# Patient Record
Sex: Female | Born: 1980
Health system: Southern US, Community
[De-identification: ages and names within clinical notes are randomized; demographics above are authoritative.]

## PROBLEM LIST (undated history)

## (undated) DIAGNOSIS — F329 Major depressive disorder, single episode, unspecified: Secondary | ICD-10-CM

## (undated) DIAGNOSIS — J342 Deviated nasal septum: Secondary | ICD-10-CM

## (undated) DIAGNOSIS — F419 Anxiety disorder, unspecified: Secondary | ICD-10-CM

## (undated) DIAGNOSIS — K219 Gastro-esophageal reflux disease without esophagitis: Secondary | ICD-10-CM

## (undated) DIAGNOSIS — F32A Depression, unspecified: Secondary | ICD-10-CM

## (undated) DIAGNOSIS — T7840XA Allergy, unspecified, initial encounter: Secondary | ICD-10-CM

## (undated) DIAGNOSIS — M1990 Unspecified osteoarthritis, unspecified site: Secondary | ICD-10-CM

## (undated) HISTORY — PX: FRACTURE SURGERY: SHX138

## (undated) HISTORY — DX: Anxiety disorder, unspecified: F41.9

## (undated) HISTORY — DX: Allergy, unspecified, initial encounter: T78.40XA

## (undated) HISTORY — PX: NO PAST SURGERIES: SHX2092

## (undated) HISTORY — PX: JOINT REPLACEMENT: SHX530

## (undated) HISTORY — DX: Major depressive disorder, single episode, unspecified: F32.9

## (undated) HISTORY — DX: Depression, unspecified: F32.A

---

## 2005-03-28 ENCOUNTER — Other Ambulatory Visit: Admission: RE | Admit: 2005-03-28 | Discharge: 2005-03-28 | Payer: Self-pay | Admitting: Gynecology

## 2006-05-21 ENCOUNTER — Other Ambulatory Visit: Admission: RE | Admit: 2006-05-21 | Discharge: 2006-05-21 | Payer: Self-pay | Admitting: Gynecology

## 2013-10-12 ENCOUNTER — Ambulatory Visit (INDEPENDENT_AMBULATORY_CARE_PROVIDER_SITE_OTHER): Payer: BC Managed Care – PPO | Admitting: Family Medicine

## 2013-10-12 VITALS — BP 110/80 | HR 103 | Temp 97.9°F | Resp 16 | Ht 61.5 in | Wt 207.0 lb

## 2013-10-12 DIAGNOSIS — R197 Diarrhea, unspecified: Secondary | ICD-10-CM

## 2013-10-12 DIAGNOSIS — R112 Nausea with vomiting, unspecified: Secondary | ICD-10-CM

## 2013-10-12 LAB — POCT CBC
Granulocyte percent: 91.4 %G — AB (ref 37–80)
HCT, POC: 45.7 % (ref 37.7–47.9)
Hemoglobin: 14.9 g/dL (ref 12.2–16.2)
Lymph, poc: 0.7 (ref 0.6–3.4)
MCH, POC: 30.2 pg (ref 27–31.2)
MCHC: 32.6 g/dL (ref 31.8–35.4)
MCV: 92.7 fL (ref 80–97)
MID (cbc): 0.4 (ref 0–0.9)
MPV: 8.9 fL (ref 0–99.8)
POC Granulocyte: 12 — AB (ref 2–6.9)
POC LYMPH PERCENT: 5.6 %L — AB (ref 10–50)
POC MID %: 3 %M (ref 0–12)
Platelet Count, POC: 250 10*3/uL (ref 142–424)
RBC: 4.93 M/uL (ref 4.04–5.48)
RDW, POC: 12.8 %
WBC: 13.1 10*3/uL — AB (ref 4.6–10.2)

## 2013-10-12 MED ORDER — ONDANSETRON 4 MG PO TBDP: 4.0000 mg | ORAL_TABLET | Freq: Three times a day (TID) | ORAL | Status: DC | PRN

## 2013-10-12 MED ORDER — SODIUM CHLORIDE 0.9 % IV SOLN: 4.0000 mg | Freq: Once | INTRAVENOUS | Status: DC

## 2013-10-12 MED ORDER — ONDANSETRON HCL 4 MG/2ML IJ SOLN
2.0000 mg | Freq: Once | INTRAMUSCULAR | Status: AC
  Administered 2013-10-12: 2 mg via INTRAMUSCULAR

## 2013-10-12 NOTE — Progress Notes (Signed)
Subjective:    Patient ID: Brandi Foster, female    DOB: [birth year], 33 y.o.   MRN: [id number]  HPI This chart was scribed for Elvina SidleKurt Lauenstein, MD by Andrew Auaven Small, ED Scribe. This patient was seen in room 1 and the patient's care was started at 8:56 AM.  HPI Comments: Brandi Foster is a 33 y.o. female who presents to the [facility] complaining of constant, simultaneous episodes of emesis and diarrhea onset 5 hours ago. Pt states that she woke up this morning at 4 AM and had her first episode of emesis. She states that shortly after she had diarrhea. She reports that she would take a sip of water and 30 mins later have multiple episodes of emesis. She reports that her last episode of emesis was while sitting in the waiting room and that her last episode of diarrhea was about 10 mins ago. She reports nausea. She denies abdominal pain. Pt states that she is on birth control. She states she takes zyrtec for allergies.    No past medical history on file. Allergies  Allergen Reactions   Codeine Other (See Comments)    Was told as a child she was allergic   Sulphadimidine [Sulfamethazine] Other (See Comments)    Was told as a child she was allergic to it.    Prior to Admission medications   Medication Sig Start Date End Date Taking? Authorizing Provider  cetirizine (ZYRTEC) 10 MG tablet Take 10 mg by mouth daily.   Yes Historical Provider, MD  norgestimate-ethinyl estradiol (SPRINTEC 28) 0.25-35 MG-MCG tablet Take 1 tablet by mouth daily.   Yes Historical Provider, MD  sertraline (ZOLOFT) 25 MG tablet Take 25 mg by mouth daily.   Yes Historical Provider, MD    Review of Systems  Gastrointestinal: Positive for nausea, vomiting and diarrhea. Negative for abdominal pain.       Objective:   Physical Exam  Nursing note and vitals reviewed. Constitutional: She is oriented to person, place, and time. She appears well-developed and well-nourished. No distress.  HENT:    Head: Normocephalic and atraumatic.  Eyes: EOM are normal.  Neck: Neck supple.  Cardiovascular: Normal rate, regular rhythm and normal heart sounds.  Exam reveals no gallop and no friction rub.   No murmur heard. Pulmonary/Chest: Effort normal and breath sounds normal.  Abdominal:  Absent bowel sounds   Musculoskeletal: Normal range of motion.  Neurological: She is alert and oriented to person, place, and time.  Skin: Skin is warm and dry.  Psychiatric: She has a normal mood and affect. Her behavior is normal.   Results for orders placed in visit on 10/12/13  POCT CBC      Result Value Ref Range   WBC 13.1 (*) 4.6 - 10.2 K/uL   Lymph, poc 0.7  0.6 - 3.4   POC LYMPH PERCENT 5.6 (*) 10 - 50 %L   MID (cbc) 0.4  0 - 0.9   POC MID % 3.0  0 - 12 %M   POC Granulocyte 12.0 (*) 2 - 6.9   Granulocyte percent 91.4 (*) 37 - 80 %G   RBC 4.93  4.04 - 5.48 M/uL   Hemoglobin 14.9  12.2 - 16.2 g/dL   HCT, POC 40.945.7  81.137.7 - 47.9 %   MCV 92.7  80 - 97 fL   MCH, POC 30.2  27 - 31.2 pg   MCHC 32.6  31.8 - 35.4 g/dL   RDW, POC 12.8  Platelet Count, POC 250  142 - 424 K/uL   MPV 8.9  0 - 99.8 fL       Assessment & Plan:    Meds ordered this encounter  Medications   sertraline (ZOLOFT) 25 MG tablet    Sig: Take 25 mg by mouth daily.   cetirizine (ZYRTEC) 10 MG tablet    Sig: Take 10 mg by mouth daily.   norgestimate-ethinyl estradiol (SPRINTEC 28) 0.25-35 MG-MCG tablet    Sig: Take 1 tablet by mouth daily.   ondansetron (ZOFRAN) injection 2 mg    Sig:

## 2013-10-12 NOTE — Patient Instructions (Signed)
Nausea and Vomiting °Nausea is a sick feeling that often comes before throwing up (vomiting). Vomiting is a reflex where stomach contents come out of your mouth. Vomiting can cause severe loss of body fluids (dehydration). Children and elderly adults can become dehydrated quickly, especially if they also have diarrhea. Nausea and vomiting are symptoms of a condition or disease. It is important to find the cause of your symptoms. °CAUSES  °· Direct irritation of the stomach lining. This irritation can result from increased acid production (gastroesophageal reflux disease), infection, food poisoning, taking certain medicines (such as nonsteroidal anti-inflammatory drugs), alcohol use, or tobacco use. °· Signals from the brain. These signals could be caused by a headache, heat exposure, an inner ear disturbance, increased pressure in the brain from injury, infection, a tumor, or a concussion, pain, emotional stimulus, or metabolic problems. °· An obstruction in the gastrointestinal tract (bowel obstruction). °· Illnesses such as diabetes, hepatitis, gallbladder problems, appendicitis, kidney problems, cancer, sepsis, atypical symptoms of a heart attack, or eating disorders. °· Medical treatments such as chemotherapy and radiation. °· Receiving medicine that makes you sleep (general anesthetic) during surgery. °DIAGNOSIS °Your caregiver may ask for tests to be done if the problems do not improve after a few days. Tests may also be done if symptoms are severe or if the reason for the nausea and vomiting is not clear. Tests may include: °· Urine tests. °· Blood tests. °· Stool tests. °· Cultures (to look for evidence of infection). °· X-rays or other imaging studies. °Test results can help your caregiver make decisions about treatment or the need for additional tests. °TREATMENT °You need to stay well hydrated. Drink frequently but in small amounts. You may wish to drink water, sports drinks, clear broth, or eat frozen  ice pops or gelatin dessert to help stay hydrated. When you eat, eating slowly may help prevent nausea. There are also some antinausea medicines that may help prevent nausea. °HOME CARE INSTRUCTIONS  °· Take all medicine as directed by your caregiver. °· If you do not have an appetite, do not force yourself to eat. However, you must continue to drink fluids. °· If you have an appetite, eat a normal diet unless your caregiver tells you differently. °· Eat a variety of complex carbohydrates (rice, wheat, potatoes, bread), lean meats, yogurt, fruits, and vegetables. °· Avoid high-fat foods because they are more difficult to digest. °· Drink enough water and fluids to keep your urine clear or pale yellow. °· If you are dehydrated, ask your caregiver for specific rehydration instructions. Signs of dehydration may include: °· Severe thirst. °· Dry lips and mouth. °· Dizziness. °· Dark urine. °· Decreasing urine frequency and amount. °· Confusion. °· Rapid breathing or pulse. °SEEK IMMEDIATE MEDICAL CARE IF:  °· You have blood or brown flecks (like coffee grounds) in your vomit. °· You have black or bloody stools. °· You have a severe headache or stiff neck. °· You are confused. °· You have severe abdominal pain. °· You have chest pain or trouble breathing. °· You do not urinate at least once every 8 hours. °· You develop cold or clammy skin. °· You continue to vomit for longer than 24 to 48 hours. °· You have a fever. °MAKE SURE YOU:  °· Understand these instructions. °· Will watch your condition. °· Will get help right away if you are not doing well or get worse. °Document Released: 07/24/2005 Document Revised: 10/16/2011 Document Reviewed: 12/21/2010 °Exit[employer]® Patient Information ©2014 [employer]. ° °Diarrhea °Diarrhea is frequent   loose and watery bowel movements. It can cause you to feel weak and dehydrated. Dehydration can cause you to become tired and thirsty, have a dry mouth, and have decreased urination that  often is dark yellow. Diarrhea is a sign of another problem, most often an infection that will not last long. In most cases, diarrhea typically lasts 2 3 days. However, it can last longer if it is a sign of something more serious. It is important to treat your diarrhea as directed by your caregive to lessen or prevent future episodes of diarrhea. °CAUSES  °Some common causes include: °· Gastrointestinal infections caused by viruses, bacteria, or parasites. °· Food poisoning or food allergies. °· Certain medicines, such as antibiotics, chemotherapy, and laxatives. °· Artificial sweeteners and fructose. °· Digestive disorders. °HOME CARE INSTRUCTIONS °· Ensure adequate fluid intake (hydration): have 1 cup (8 oz) of fluid for each diarrhea episode. Avoid fluids that contain simple sugars or sports drinks, fruit juices, whole milk products, and sodas. Your urine should be clear or pale yellow if you are drinking enough fluids. Hydrate with an oral rehydration solution that you can purchase at pharmacies, retail stores, and online. You can prepare an oral rehydration solution at home by mixing the following ingredients together: °·   tsp table salt. °· ¾ tsp baking soda. °·  tsp salt substitute containing potassium chloride. °· 1  tablespoons sugar. °· 1 L (34 oz) of water. °· Certain foods and beverages may increase the speed at which food moves through the gastrointestinal (GI) tract. These foods and beverages should be avoided and include: °· Caffeinated and alcoholic beverages. °· High-fiber foods, such as raw fruits and vegetables, nuts, seeds, and whole grain breads and cereals. °· Foods and beverages sweetened with sugar alcohols, such as xylitol, sorbitol, and mannitol. °· Some foods may be well tolerated and may help thicken stool including: °· Starchy foods, such as rice, toast, pasta, low-sugar cereal, oatmeal, grits, baked potatoes, crackers, and bagels. °· Bananas. °· Applesauce. °· Add probiotic-rich foods  to help increase healthy bacteria in the GI tract, such as yogurt and fermented milk products. °· Wash your hands well after each diarrhea episode. °· Only take over-the-counter or prescription medicines as directed by your caregiver. °· Take a warm bath to relieve any burning or pain from frequent diarrhea episodes. °SEEK IMMEDIATE MEDICAL CARE IF:  °· You are unable to keep fluids down. °· You have persistent vomiting. °· You have blood in your stool, or your stools are black and tarry. °· You do not urinate in 6 8 hours, or there is only a small amount of very dark urine. °· You have abdominal pain that increases or localizes. °· You have weakness, dizziness, confusion, or lightheadedness. °· You have a severe headache. °· Your diarrhea gets worse or does not get better. °· You have a fever or persistent symptoms for more than 2 3 days. °· You have a fever and your symptoms suddenly get worse. °MAKE SURE YOU:  °· Understand these instructions. °· Will watch your condition. °· Will get help right away if you are not doing well or get worse. °Document Released: 07/14/2002 Document Revised: 07/10/2012 Document Reviewed: 03/31/2012 °ExitCare® Patient Information ©2014 [employer]. ° °

## 2014-09-04 ENCOUNTER — Ambulatory Visit (INDEPENDENT_AMBULATORY_CARE_PROVIDER_SITE_OTHER): Payer: 59

## 2014-09-04 ENCOUNTER — Ambulatory Visit (INDEPENDENT_AMBULATORY_CARE_PROVIDER_SITE_OTHER): Payer: 59 | Admitting: Emergency Medicine

## 2014-09-04 VITALS — BP 134/92 | HR 75 | Temp 98.7°F | Resp 16 | Ht 62.75 in | Wt 225.0 lb

## 2014-09-04 DIAGNOSIS — R059 Cough, unspecified: Secondary | ICD-10-CM

## 2014-09-04 DIAGNOSIS — R05 Cough: Secondary | ICD-10-CM

## 2014-09-04 IMAGING — CR DG CHEST 2V
2 series · 2 of 2 positions shown · non-contrast
Comparison: None.

CLINICAL DATA: Cough

EXAM:
CHEST  2 VIEW

[PA]
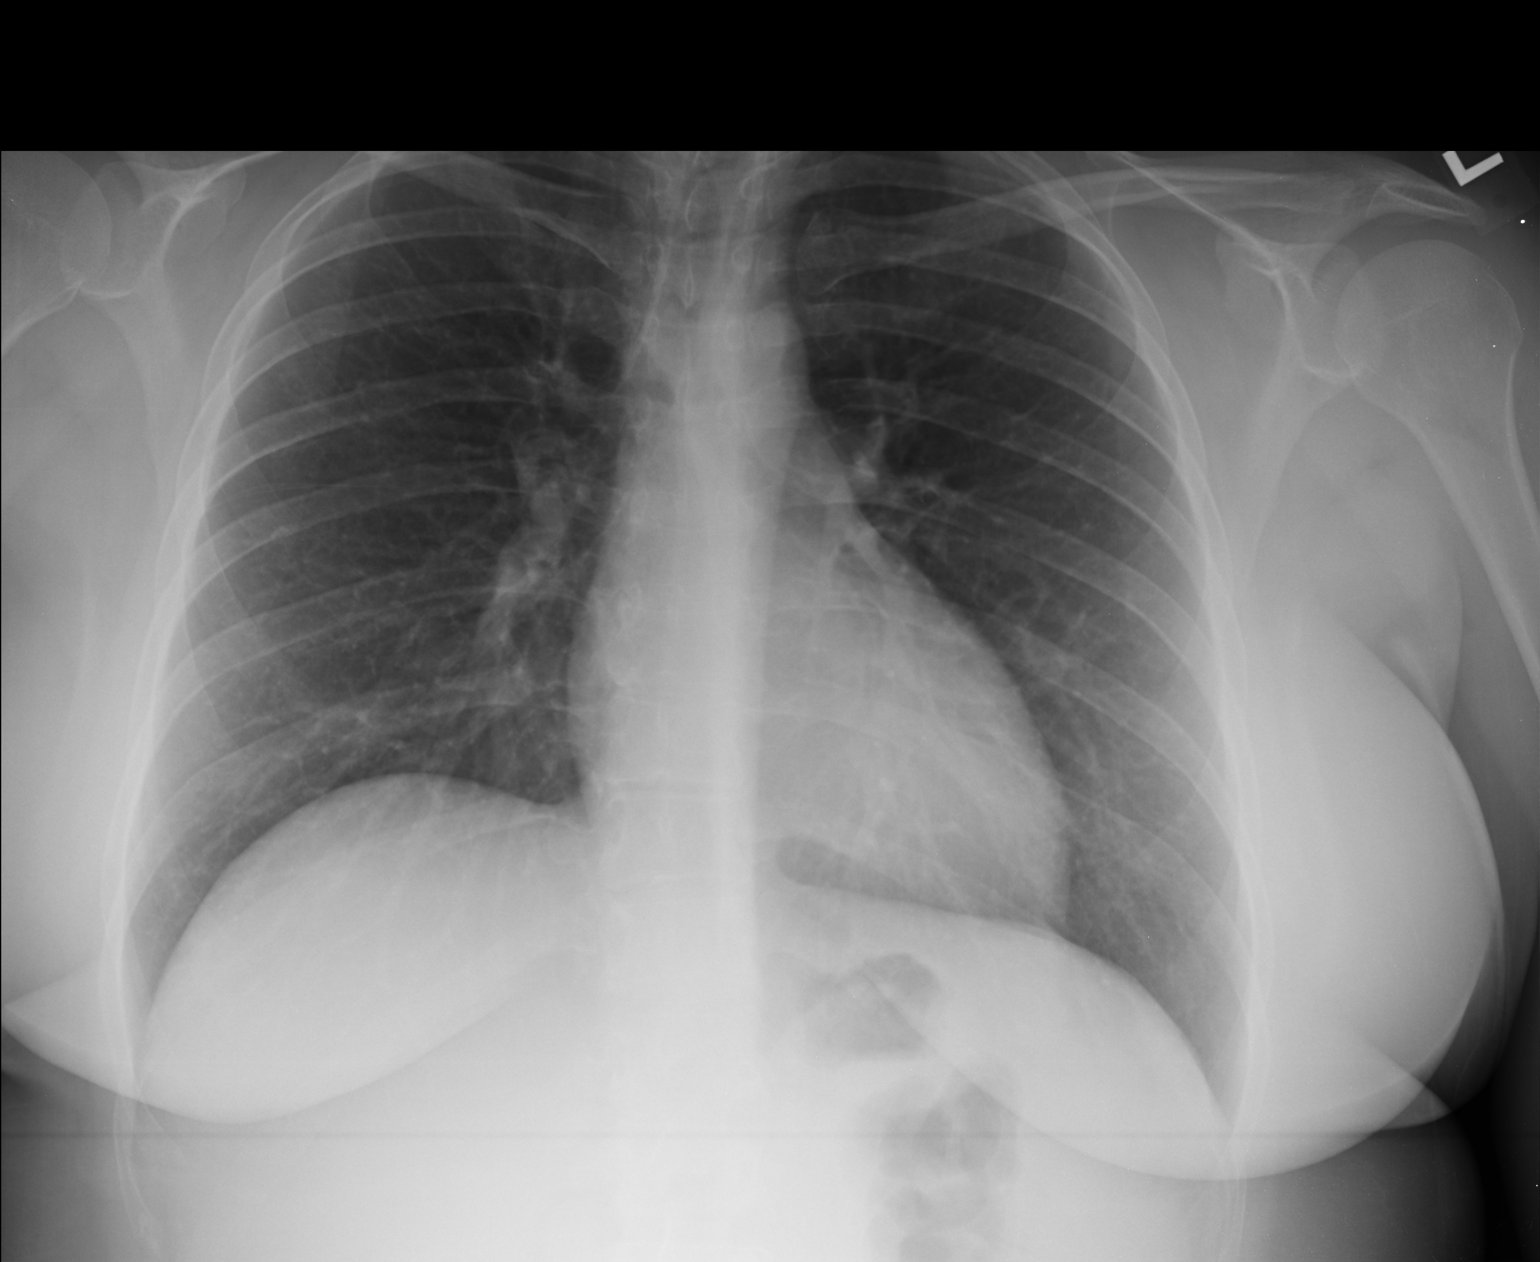

[lateral]
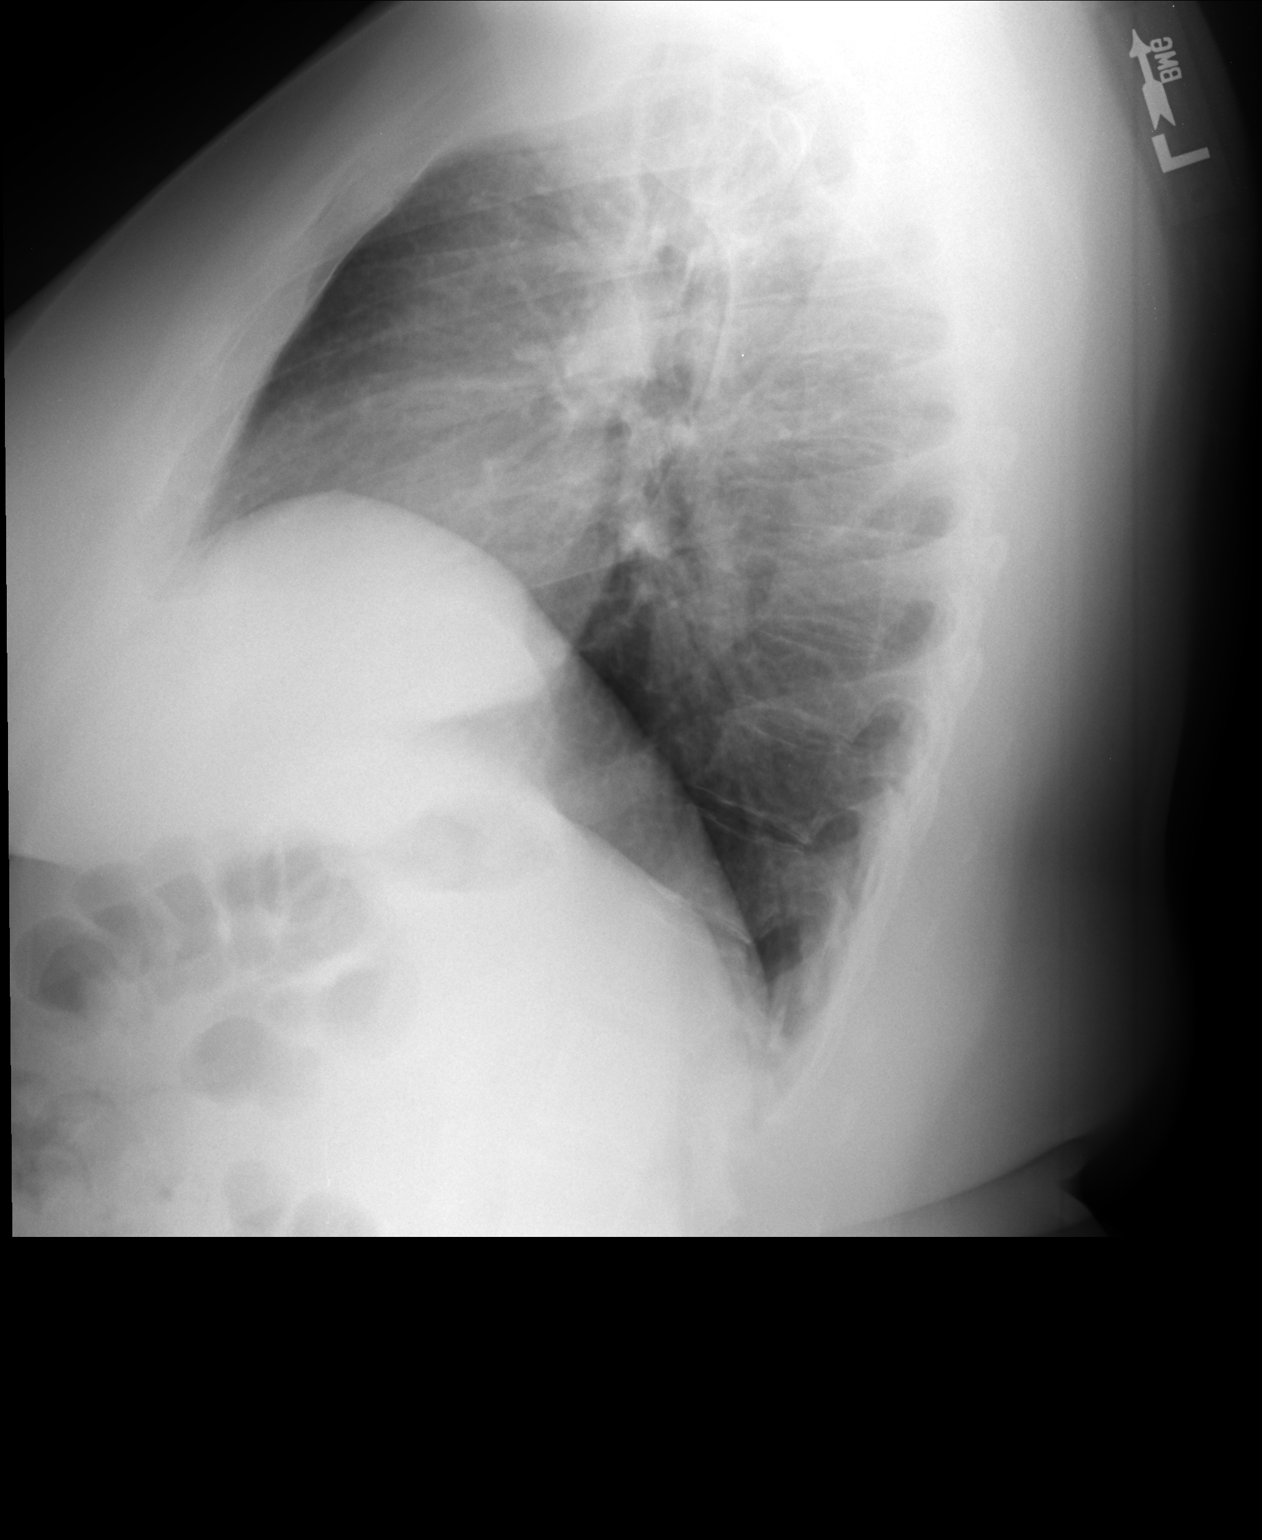

[2 of 2 positions shown; findings below may reference images not displayed]

FINDINGS: The heart size and mediastinal contours are within normal limits.
Both lungs are clear. The visualized skeletal structures are
unremarkable.
IMPRESSION: No active cardiopulmonary disease.

## 2014-09-04 MED ORDER — PREDNISONE 10 MG PO TABS: ORAL_TABLET | ORAL | Status: DC

## 2014-09-04 MED ORDER — OMEPRAZOLE 20 MG PO CPDR: 20.0000 mg | DELAYED_RELEASE_CAPSULE | Freq: Every day | ORAL | Status: DC

## 2014-09-04 NOTE — Progress Notes (Addendum)
Subjective:  This chart was scribed for Lesle ChrisSteven Maylin Freeburg MD, by Veverly FellsHatice Demirci,scribe, at [facility].  This patient was seen in room 4 and the patient's care was started at 3:25 PM.    Patient ID: Brandi MouldHeather J Crews, female    DOB: 06-Oct-1980, 34 y.o.   MRN: [id number]  HPI HPI Comments: Brandi MouldHeather J Crews is a 34 y.o. female who presents to [facility] with a cough which she has had for a month.  States she has coughing fits and is out of breath more easily than usual. She notes of postnasal drip but states that everything else is dry. She also notes of having reflux symptoms and had heart burn and threw up four nights ago.  She notes she feels fatigued and states her cough is keeping her up at night. Patient is not a smoker,is on birth control pills and not currently pregnant. Denies a fever.       There are no active problems to display for this patient.  History reviewed. No pertinent past medical history. History reviewed. No pertinent past surgical history. Allergies  Allergen Reactions  . Codeine Other (See Comments)    Was told as a child she was allergic  . Sulphadimidine [Sulfamethazine] Other (See Comments)    Was told as a child she was allergic to it.    Prior to Admission medications   Medication Sig Start Date End Date Taking? Authorizing Provider  cetirizine (ZYRTEC) 10 MG tablet Take 10 mg by mouth daily.   Yes Historical Provider, MD  norgestimate-ethinyl estradiol (SPRINTEC 28) 0.25-35 MG-MCG tablet Take 1 tablet by mouth daily.   Yes Historical Provider, MD  ondansetron (ZOFRAN ODT) 4 MG disintegrating tablet Take 1 tablet (4 mg total) by mouth every 8 (eight) hours as needed for nausea or vomiting. 10/12/13  Yes Elvina SidleKurt Lauenstein, MD  sertraline (ZOLOFT) 25 MG tablet Take 25 mg by mouth daily.   Yes Historical Provider, MD   History   Social History  . Marital Status: Single    Spouse Name: N/A    Number of Children: N/A  . Years of  Education: N/A   Occupational History  . Not on file.   Social History Main Topics  . Smoking status: Never Smoker   . Smokeless tobacco: Never Used  . Alcohol Use: Yes  . Drug Use: No  . Sexual Activity: Not on file   Other Topics Concern  . Not on file   Social History Narrative       Review of Systems  Constitutional: Negative for fever and chills.  HENT: Positive for postnasal drip.   Respiratory: Positive for cough.        Objective:   Physical Exam CONSTITUTIONAL: Well developed/well nourished HEAD: Normocephalic/atraumatic EYES: EOMI/PERRL ENMT: Mucous membranes moist NECK: supple no meningeal signs SPINE/BACK:entire spine nontender CV: S1/S2 noted, no murmurs/rubs/gallops noted LUNGS: Lungs are clear to auscultation bilaterally.  She has a high pitched croup like cough.   ABDOMEN: soft, nontender, no rebound or guarding, bowel sounds noted throughout abdomen GU:no cva tenderness NEURO: Pt is awake/alert/appropriate, moves all extremitiesx4.  No facial droop.   EXTREMITIES: pulses normal/equal, full ROM SKIN: warm, color normal PSYCH: no abnormalities of mood noted, alert and oriented to situation      Filed Vitals:   09/04/14 1519  BP: 134/92  Pulse: 75  Temp: 98.7 F (37.1 C)  TempSrc: Oral  Resp: 16  Height: 5' 2.75" (1.594 m)  Weight: 225 lb (102.059 kg)  SpO2: 99%  [facility] reading (PRIMARY) by  Dr.Kaytie Ratcliffe no acute disease      Assessment & Plan:  She will be on prednisone 20 mg a day for 3 days then 10 mg a day for 3 days. Will also have her on Prilosec 20 mg daily. I personally performed the services described in this documentation, which was scribed in my presence. The recorded information has been reviewed and is accurate.

## 2014-09-04 NOTE — Progress Notes (Deleted)
   Subjective:    Patient ID: Brandi MouldHeather J Crews, female    DOB: 11/29/1980, 34 y.o.   MRN: [id number]  HPI    Review of Systems     Objective:   Physical Exam        Assessment & Plan:

## 2015-01-11 ENCOUNTER — Emergency Department (HOSPITAL_COMMUNITY)
Admission: EM | Admit: 2015-01-11 | Discharge: 2015-01-11 | Disposition: A | Discharge status: Home / Self Care | Payer: 59 | Attending: Emergency Medicine | Admitting: Emergency Medicine

## 2015-01-11 ENCOUNTER — Encounter (HOSPITAL_COMMUNITY): Payer: Self-pay

## 2015-01-11 ENCOUNTER — Emergency Department (HOSPITAL_COMMUNITY): Payer: 59

## 2015-01-11 DIAGNOSIS — Y9389 Activity, other specified: Secondary | ICD-10-CM | POA: Diagnosis not present

## 2015-01-11 DIAGNOSIS — S52182A Other fracture of upper end of left radius, initial encounter for closed fracture: Secondary | ICD-10-CM | POA: Insufficient documentation

## 2015-01-11 DIAGNOSIS — Z79899 Other long term (current) drug therapy: Secondary | ICD-10-CM | POA: Insufficient documentation

## 2015-01-11 DIAGNOSIS — Y998 Other external cause status: Secondary | ICD-10-CM | POA: Insufficient documentation

## 2015-01-11 DIAGNOSIS — S59902A Unspecified injury of left elbow, initial encounter: Secondary | ICD-10-CM | POA: Diagnosis present

## 2015-01-11 DIAGNOSIS — S52092A Other fracture of upper end of left ulna, initial encounter for closed fracture: Secondary | ICD-10-CM | POA: Insufficient documentation

## 2015-01-11 DIAGNOSIS — W1839XA Other fall on same level, initial encounter: Secondary | ICD-10-CM | POA: Insufficient documentation

## 2015-01-11 DIAGNOSIS — S52102A Unspecified fracture of upper end of left radius, initial encounter for closed fracture: Secondary | ICD-10-CM

## 2015-01-11 DIAGNOSIS — S52202A Unspecified fracture of shaft of left ulna, initial encounter for closed fracture: Secondary | ICD-10-CM

## 2015-01-11 DIAGNOSIS — Y9289 Other specified places as the place of occurrence of the external cause: Secondary | ICD-10-CM | POA: Insufficient documentation

## 2015-01-11 IMAGING — CR DG ELBOW COMPLETE 3+V*L*
4 series · 4 of 4 positions shown · non-contrast
Comparison: None.

CLINICAL DATA: Slip and fall with elbow pain and obvious deformity,
initial encounter

EXAM:
LEFT ELBOW - COMPLETE 3+ VIEW

[x elbow ap left]
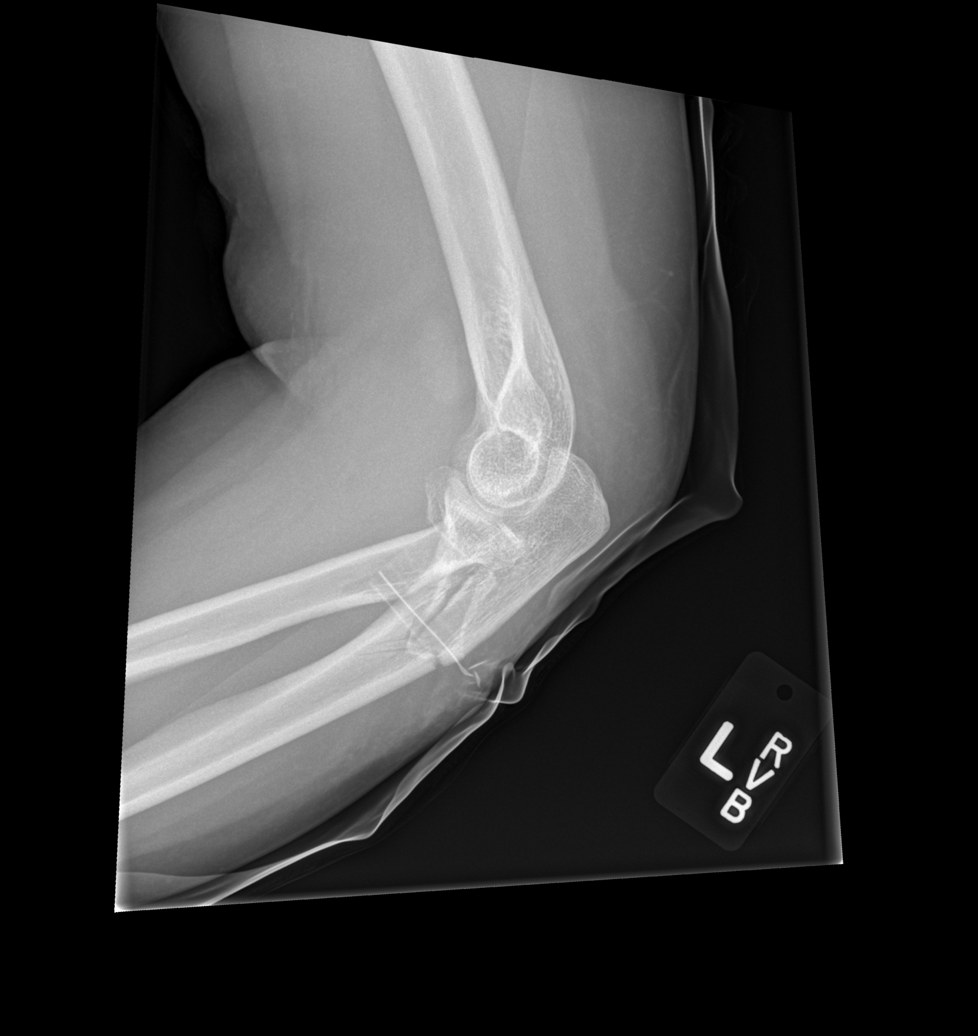

[x elbow obl left (1 of 2)]
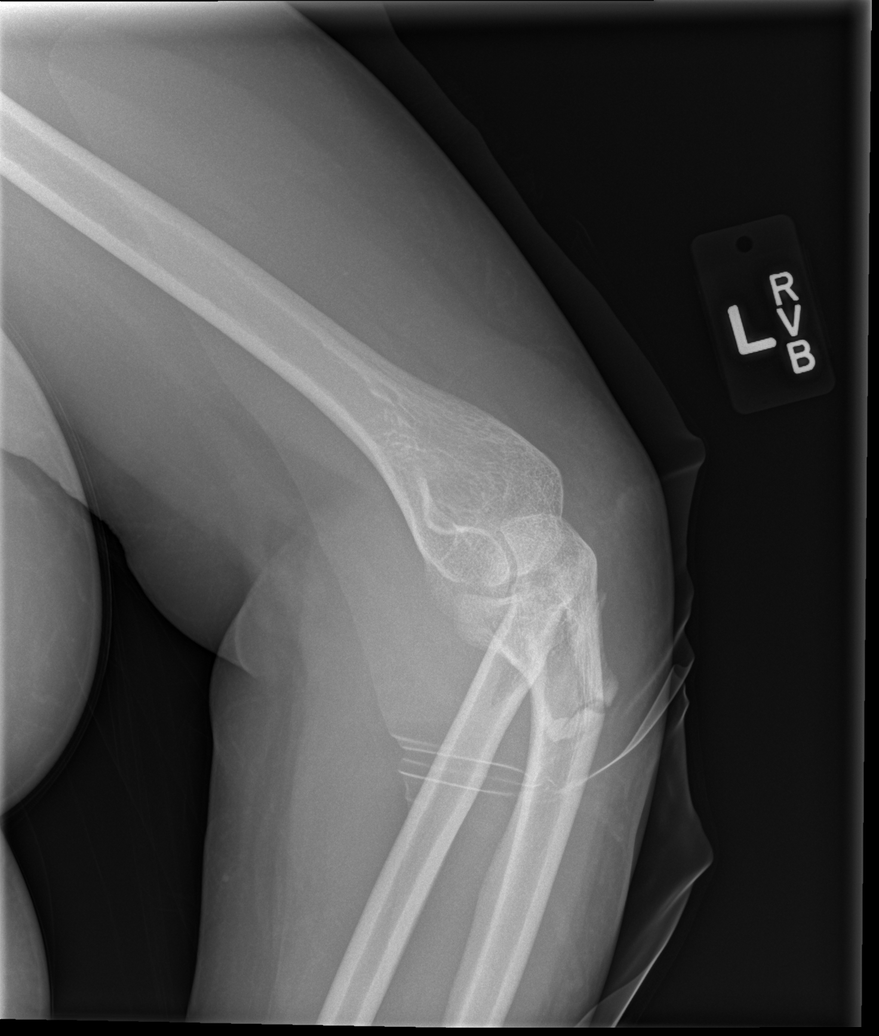

[x elbow obl left (2 of 2)]
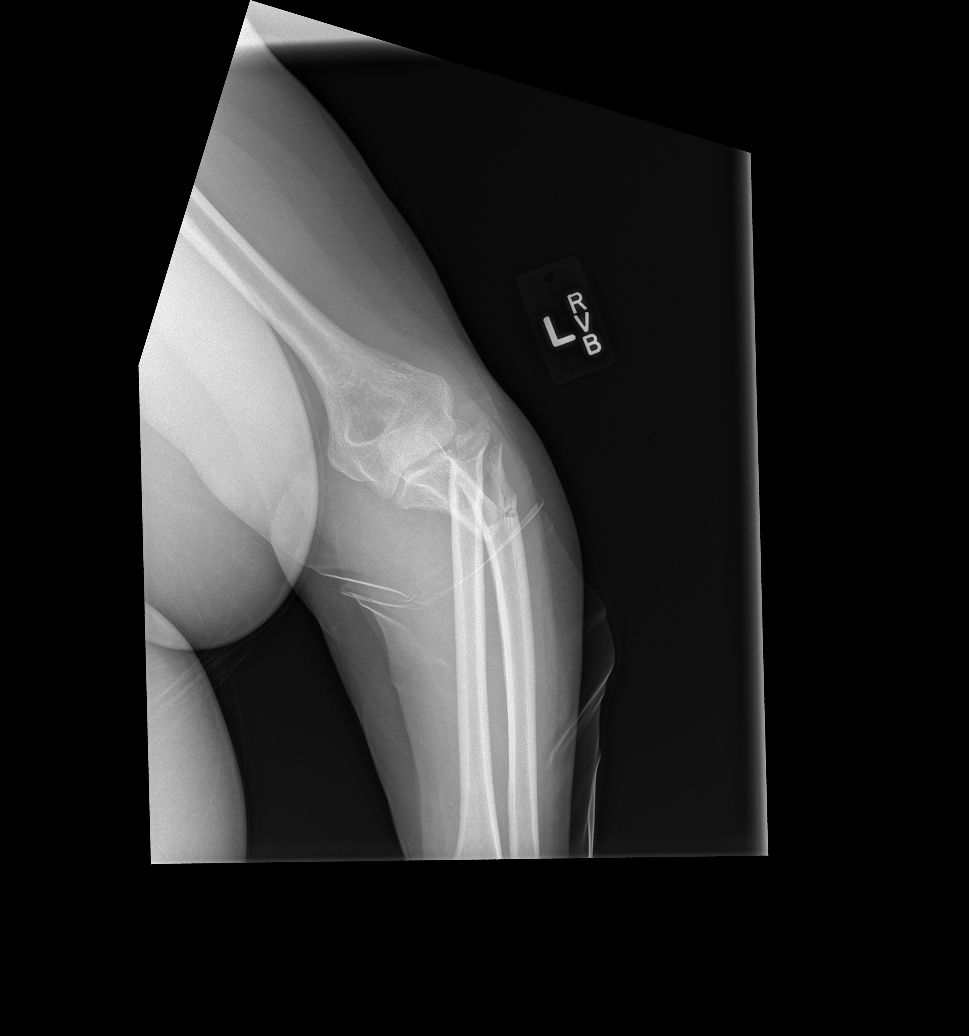

[x elbow lat left]
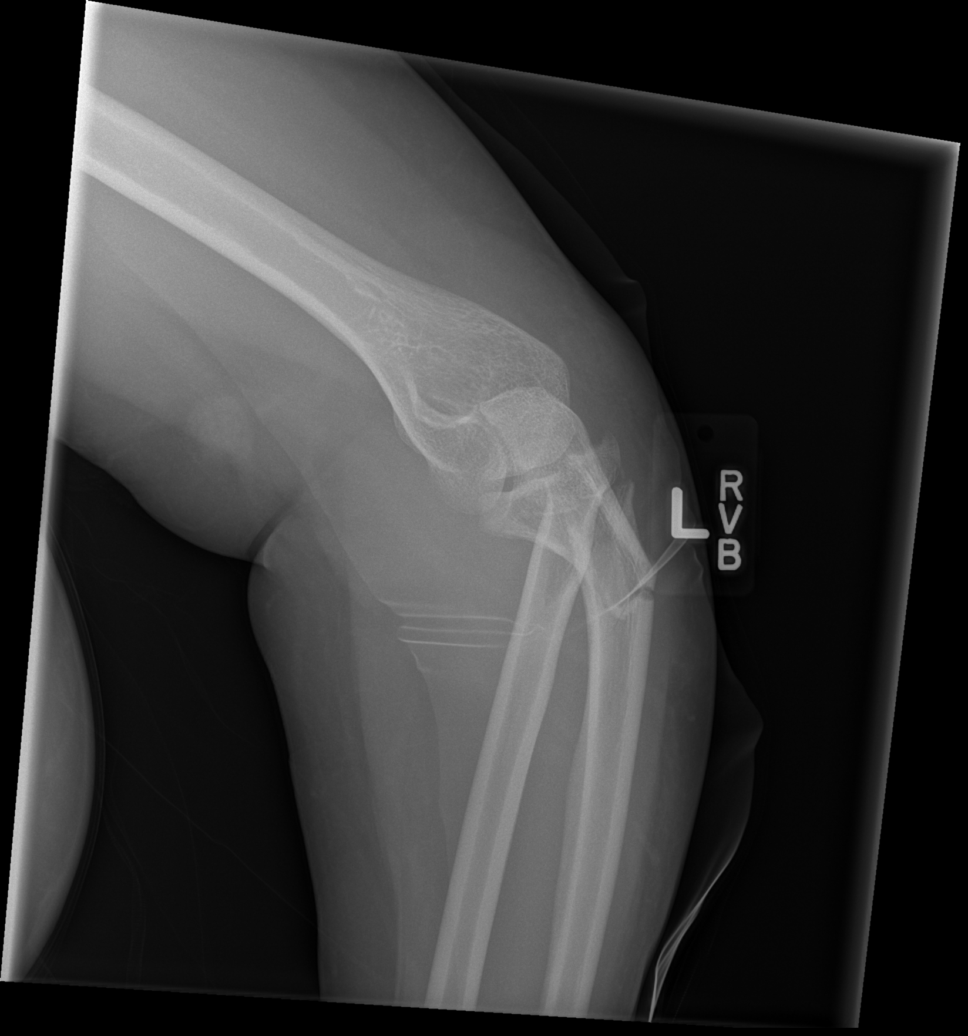

[4 of 4 positions shown; findings below may reference images not displayed]

FINDINGS: There is a comminuted fracture of the proximal ulna with angulation
at the fracture site. Additionally there is comminuted fracture
involving the radial head and radial neck. In addition to the
angulation at the fracture site there appears to be a a mild
rotatory component present. The distal humerus appears within normal
limits.
IMPRESSION: Comminuted proximal radial and ulnar fractures.

## 2015-01-11 MED ORDER — OXYCODONE-ACETAMINOPHEN 5-325 MG PO TABS
2.0000 | ORAL_TABLET | Freq: Once | ORAL | Status: AC
Start: 2015-01-11 — End: 2015-01-11
  Administered 2015-01-11: 2 via ORAL
  Filled 2015-01-11: qty 2

## 2015-01-11 MED ORDER — MORPHINE SULFATE 4 MG/ML IJ SOLN
4.0000 mg | Freq: Once | INTRAMUSCULAR | Status: AC
  Administered 2015-01-11: 4 mg via INTRAVENOUS
  Filled 2015-01-11: qty 1

## 2015-01-11 MED ORDER — OXYCODONE-ACETAMINOPHEN 5-325 MG PO TABS: 1.0000 | ORAL_TABLET | ORAL | Status: DC | PRN

## 2015-01-11 MED ORDER — ONDANSETRON 8 MG PO TBDP: ORAL_TABLET | ORAL | Status: DC

## 2015-01-11 MED ORDER — ONDANSETRON HCL 4 MG/2ML IJ SOLN
4.0000 mg | Freq: Once | INTRAMUSCULAR | Status: AC
  Administered 2015-01-11: 4 mg via INTRAVENOUS
  Filled 2015-01-11: qty 2

## 2015-01-11 NOTE — ED Notes (Signed)
Patient transported to X-ray 

## 2015-01-11 NOTE — Discharge Instructions (Signed)
1. Medications: percocet, zofran, usual home medications 2. Treatment: rest, drink plenty of fluids, elevate you arm, wear sling, keep splint dry 3. Follow Up: Please call tomorrow morning to make an appointment for tomorrow afternoon with Dr. Mina MarbleWeingold with planned repair of your elbow on Wednesday; Please return to the ER for worsening pain    Cast or Splint Care Casts and splints support injured limbs and keep bones from moving while they heal. It is important to care for your cast or splint at home.  HOME CARE INSTRUCTIONS  Keep the cast or splint uncovered during the drying period. It can take 24 to 48 hours to dry if it is made of plaster. A fiberglass cast will dry in less than 1 hour.  Do not rest the cast on anything harder than a pillow for the first 24 hours.  Do not put weight on your injured limb or apply pressure to the cast until your health care provider gives you permission.  Keep the cast or splint dry. Wet casts or splints can lose their shape and may not support the limb as well. A wet cast that has lost its shape can also create harmful pressure on your skin when it dries. Also, wet skin can become infected.  Cover the cast or splint with a plastic bag when bathing or when out in the rain or snow. If the cast is on the trunk of the body, take sponge baths until the cast is removed.  If your cast does become wet, dry it with a towel or a blow dryer on the cool setting only.  Keep your cast or splint clean. Soiled casts may be wiped with a moistened cloth.  Do not place any hard or soft foreign objects under your cast or splint, such as cotton, toilet paper, lotion, or powder.  Do not try to scratch the skin under the cast with any object. The object could get stuck inside the cast. Also, scratching could lead to an infection. If itching is a problem, use a blow dryer on a cool setting to relieve discomfort.  Do not trim or cut your cast or remove padding from inside of  it.  Exercise all joints next to the injury that are not immobilized by the cast or splint. For example, if you have a long leg cast, exercise the hip joint and toes. If you have an arm cast or splint, exercise the shoulder, elbow, thumb, and fingers.  Elevate your injured arm or leg on 1 or 2 pillows for the first 1 to 3 days to decrease swelling and pain.It is best if you can comfortably elevate your cast so it is higher than your heart. SEEK MEDICAL CARE IF:   Your cast or splint cracks.  Your cast or splint is too tight or too loose.  You have unbearable itching inside the cast.  Your cast becomes wet or develops a soft spot or area.  You have a bad smell coming from inside your cast.  You get an object stuck under your cast.  Your skin around the cast becomes red or raw.  You have new pain or worsening pain after the cast has been applied. SEEK IMMEDIATE MEDICAL CARE IF:   You have fluid leaking through the cast.  You are unable to move your fingers or toes.  You have discolored (blue or white), cool, painful, or very swollen fingers or toes beyond the cast.  You have tingling or numbness around the injured area.  You have severe pain or pressure under the cast.  You have any difficulty with your breathing or have shortness of breath.  You have chest pain. Document Released: 07/21/2000 Document Revised: 05/14/2013 Document Reviewed: 01/30/2013 [facility] Patient Information 2015 [place], [place]. This information is not intended to replace advice given to you by your health care provider. Make sure you discuss any questions you have with your health care provider.

## 2015-01-11 NOTE — ED Notes (Signed)
Pt presents with c/o fall and left elbow injury. Pt reports she tripped and fell on that arm. Pt denies hitting her head. 18g right AC placed by EMS, of fentanyl given by EMS. Pt is alert and oriented.

## 2015-01-11 NOTE — ED Notes (Signed)
[facility] Expected date:  Expected time:  Means of arrival:  Comments: EMS/69F/fall/L elbow deformity

## 2015-01-11 NOTE — ED Provider Notes (Signed)
CSN: [id number]     Arrival date & time 01/11/15  1802 History   First MD Initiated Contact with Patient 01/11/15 1855     Chief Complaint  Patient presents with  . Fall  . Elbow Injury     (Consider location/radiation/quality/duration/timing/severity/associated sxs/prior Treatment) The history is provided by the patient and medical records. No language interpreter was used.     Brandi Foster is a 34 y.o. female  with no major medical hx presents to the Emergency Department complaining of acute, persistent left elbow pain onset 1 hour PTA after fall.  Pt reports she stepped off a curb and twisted her left ankle causing her to fall.  She reports falling forward and putting her elbow out to stop her.  She reports deformity to the left elbow and significant pain.  Associated symptoms include swelling, ecchymosis and pain.  Pt was given Fentanyl by EMS with moderate relief.  She reports intact sensation and movement in her fingers. Movement of any part of her arm makes her pain significantly worse. She denies previous surgeries on this arm.  Patient reports she is right-handed. She works for a [employer].   History reviewed. No pertinent past medical history. History reviewed. No pertinent past surgical history. No family history on file. History  Substance Use Topics  . Smoking status: Never Smoker   . Smokeless tobacco: Never Used  . Alcohol Use: Yes   OB History    No data available     Review of Systems  Constitutional: Negative for fever and chills.  Gastrointestinal: Negative for nausea and vomiting.  Musculoskeletal: Positive for joint swelling and arthralgias. Negative for back pain, neck pain and neck stiffness.  Skin: Negative for wound.  Neurological: Negative for numbness.  Hematological: Does not bruise/bleed easily.  Psychiatric/Behavioral: The patient is not nervous/anxious.   All other systems reviewed and are negative.     Allergies  Codeine and  Sulphadimidine  Home Medications   Prior to Admission medications   Medication Sig Start Date End Date Taking? Authorizing Provider  cetirizine (ZYRTEC) 10 MG tablet Take 10 mg by mouth daily.   Yes Historical Provider, MD  LORazepam (ATIVAN) 0.5 MG tablet Take 0.5 mg by mouth every 8 (eight) hours.   Yes Historical Provider, MD  norgestimate-ethinyl estradiol (SPRINTEC 28) 0.25-35 MG-MCG tablet Take 1 tablet by mouth daily.   Yes Historical Provider, MD  omeprazole (PRILOSEC) 20 MG capsule Take 1 capsule (20 mg total) by mouth daily. Patient taking differently: Take 20 mg by mouth daily as needed.  09/04/14  Yes Collene GobbleSteven A Daub, MD  sertraline (ZOLOFT) 25 MG tablet Take 25 mg by mouth daily.   Yes Historical Provider, MD  ondansetron (ZOFRAN ODT) 4 MG disintegrating tablet Take 1 tablet (4 mg total) by mouth every 8 (eight) hours as needed for nausea or vomiting. Patient not taking: Reported on 01/11/2015 10/12/13   Elvina SidleKurt Lauenstein, MD  ondansetron [facility](ZOFRAN ODT) 8 MG disintegrating tablet 8mg  ODT q4 hours prn nausea 01/11/15   Dahlia ClientHannah Shelly Spenser, PA-C  oxyCODONE-acetaminophen (PERCOCET/ROXICET) 5-325 MG per tablet Take 1-2 tablets by mouth every 4 (four) hours as needed for severe pain. 01/11/15   Rise Traeger, PA-C  predniSONE (DELTASONE) 10 MG tablet Take 2 a day for 3 days and one a day for 3 days Patient not taking: Reported on 01/11/2015 09/04/14   Collene GobbleSteven A Daub, MD   BP 163/101 mmHg  Pulse 62  Temp(Src) 98.1 F (36.7 C) (Oral)  Resp 18  SpO2 98%  LMP 01/08/2015 (Approximate) Physical Exam  Constitutional: She appears well-developed and well-nourished. No distress.  HENT:  Head: Normocephalic and atraumatic.  Eyes: Conjunctivae are normal.  Neck: Normal range of motion.  Cardiovascular: Normal rate, regular rhythm, normal heart sounds and intact distal pulses.   No murmur heard. Capillary refill < 3 sec  Pulmonary/Chest: Effort normal and breath sounds normal.  Musculoskeletal: She  exhibits tenderness. She exhibits no edema.  Full range of motion of all fingers of the left hand Somewhat decreased range of motion of the left wrist due to pain No range of motion to the left elbow or left shoulder due to pain Tenderness to palpation of the left wrist or left shoulder Exquisite tenderness to palpation of the left elbow and proximal left forearm with swelling and ecchymosis noted No open fracture  Neurological: She is alert. Coordination normal.  Sensation intact to dull and sharp in the left upper extremity Strength strong grip strength, otherwise 0/5 strength in the left elbow  Skin: Skin is warm and dry. She is not diaphoretic.  No tenting of the skin  Psychiatric: She has a normal mood and affect.  Nursing note and vitals reviewed.   ED Course  Procedures (including critical care time) Labs Review Labs Reviewed - No data to display  Imaging Review Dg Elbow Complete Left  01/11/2015   CLINICAL DATA:  Slip and fall with elbow pain and obvious deformity, initial encounter  EXAM: LEFT ELBOW - COMPLETE 3+ VIEW  COMPARISON:  None.  FINDINGS: There is a comminuted fracture of the proximal ulna with angulation at the fracture site. Additionally there is comminuted fracture involving the radial head and radial neck. In addition to the angulation at the fracture site there appears to be a a mild rotatory component present. The distal humerus appears within normal limits.  IMPRESSION: Comminuted proximal radial and ulnar fractures.   Electronically Signed   By: Alcide Clever M.D.   On: 01/11/2015 20:04     EKG Interpretation None      MDM   Final diagnoses:  Left ulnar fracture, closed, initial encounter  Fracture, radius, proximal, left, closed, initial encounter   Brandi Foster presents after fall. She did not hit her head or have loss of consciousness.  X-ray with comminuted proximal radial and ulnar fractures.    Patient discussed with Dr. Mina Marble who requests  posterior splint be placed and he will see her in the office tomorrow.  Pulse motor and sensation with good capillary refill before and after splint placement.  Pt pain is controlled here in the emergency department. She will be discharged home with pain control. Strict follow-up instructions given.  BP 163/101 mmHg  Pulse 62  Temp(Src) 98.1 F (36.7 C) (Oral)  Resp 18  SpO2 98%  LMP 01/08/2015 (Approximate)   [facility], PA-C 01/12/15 0454  Mancel Bale, MD 01/18/15 1944

## 2015-01-13 HISTORY — PX: FRACTURE SURGERY: SHX138

## 2015-06-24 DIAGNOSIS — S52122G Displaced fracture of head of left radius, subsequent encounter for closed fracture with delayed healing: Secondary | ICD-10-CM | POA: Insufficient documentation

## 2015-07-09 ENCOUNTER — Other Ambulatory Visit: Payer: Self-pay | Admitting: Emergency Medicine

## 2015-07-09 ENCOUNTER — Ambulatory Visit (INDEPENDENT_AMBULATORY_CARE_PROVIDER_SITE_OTHER): Payer: 59

## 2015-07-09 ENCOUNTER — Ambulatory Visit (INDEPENDENT_AMBULATORY_CARE_PROVIDER_SITE_OTHER): Payer: 59 | Admitting: Emergency Medicine

## 2015-07-09 VITALS — BP 128/88 | HR 84 | Temp 98.5°F | Resp 16 | Ht 62.75 in | Wt 231.8 lb

## 2015-07-09 DIAGNOSIS — M25572 Pain in left ankle and joints of left foot: Secondary | ICD-10-CM | POA: Diagnosis not present

## 2015-07-09 DIAGNOSIS — S92002A Unspecified fracture of left calcaneus, initial encounter for closed fracture: Secondary | ICD-10-CM | POA: Diagnosis not present

## 2015-07-09 DIAGNOSIS — M25571 Pain in right ankle and joints of right foot: Secondary | ICD-10-CM

## 2015-07-09 DIAGNOSIS — J0100 Acute maxillary sinusitis, unspecified: Secondary | ICD-10-CM | POA: Diagnosis not present

## 2015-07-09 LAB — POCT RAPID STREP A (OFFICE): Rapid Strep A Screen: NEGATIVE

## 2015-07-09 IMAGING — CR DG FOOT COMPLETE 3+V*L*
3 series · 3 of 3 positions shown · non-contrast
Comparison: None in PACs

CLINICAL DATA: This patient has sustained an injury of the foot and
ankle 1 month ago, initial visit

EXAM:
LEFT FOOT - COMPLETE 3+ VIEW; LEFT ANKLE COMPLETE - 3+ VIEW

[AP]
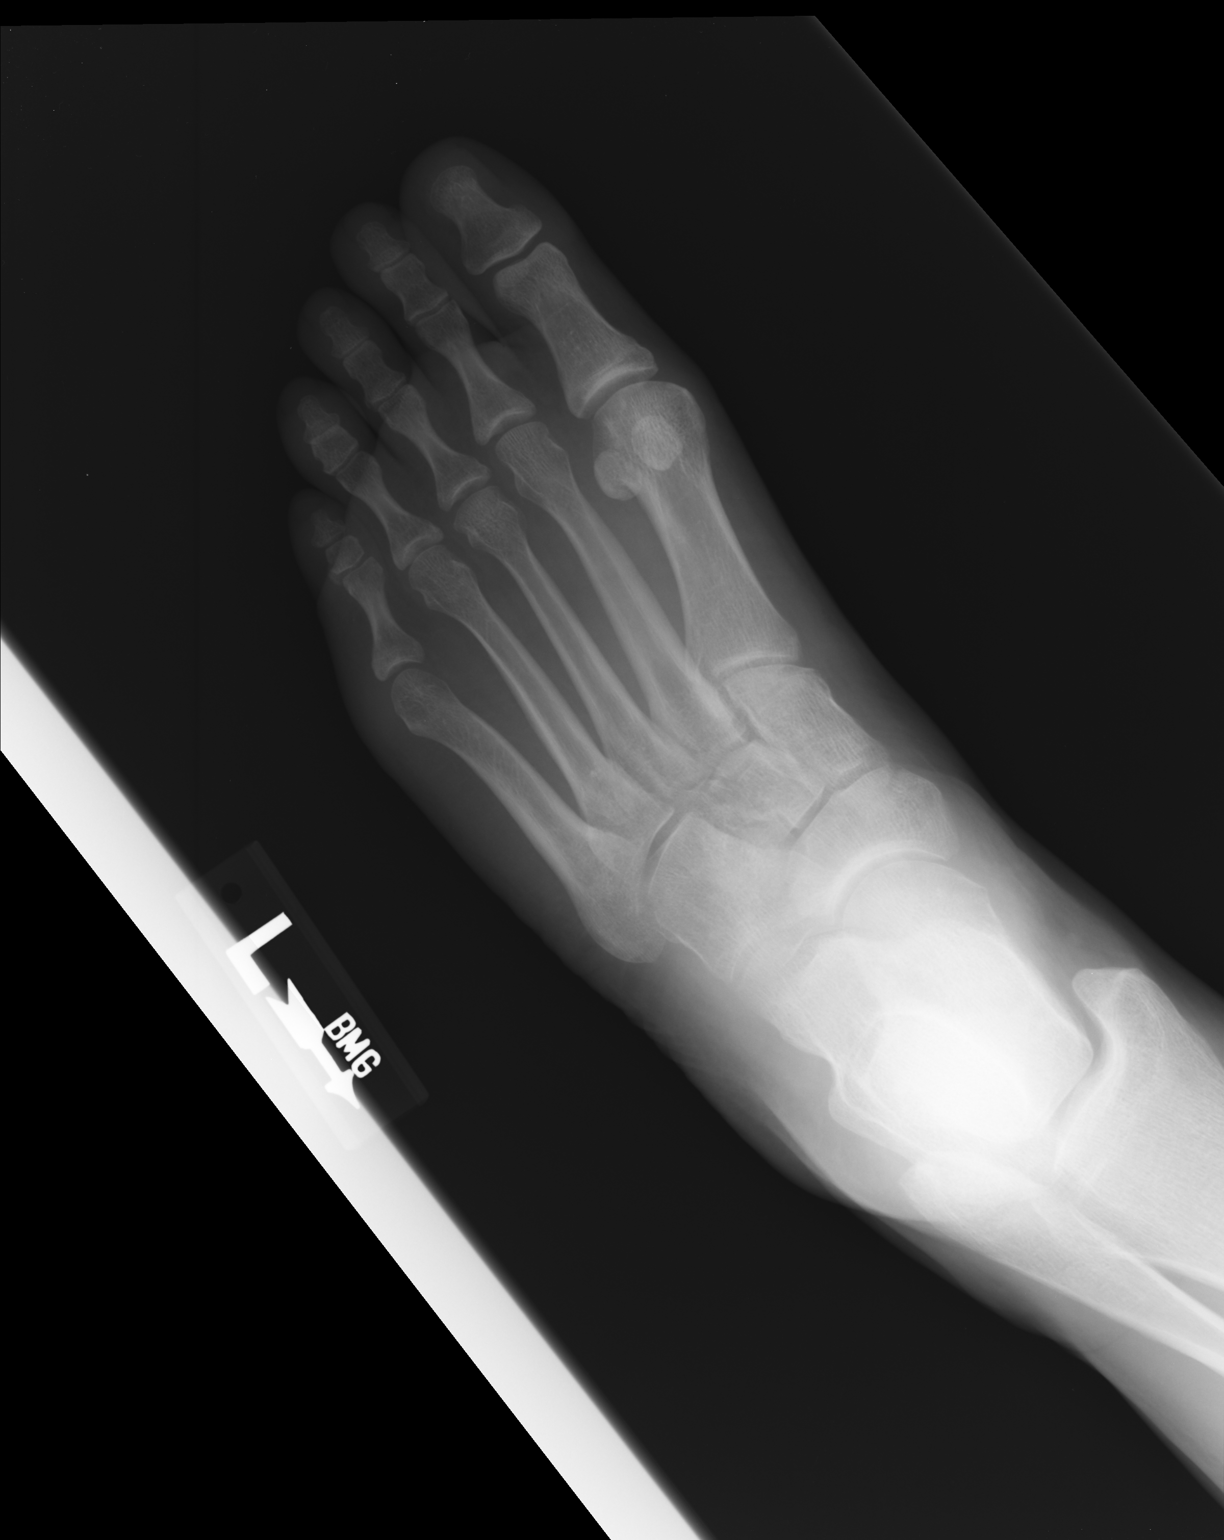

[ap obl int rot]
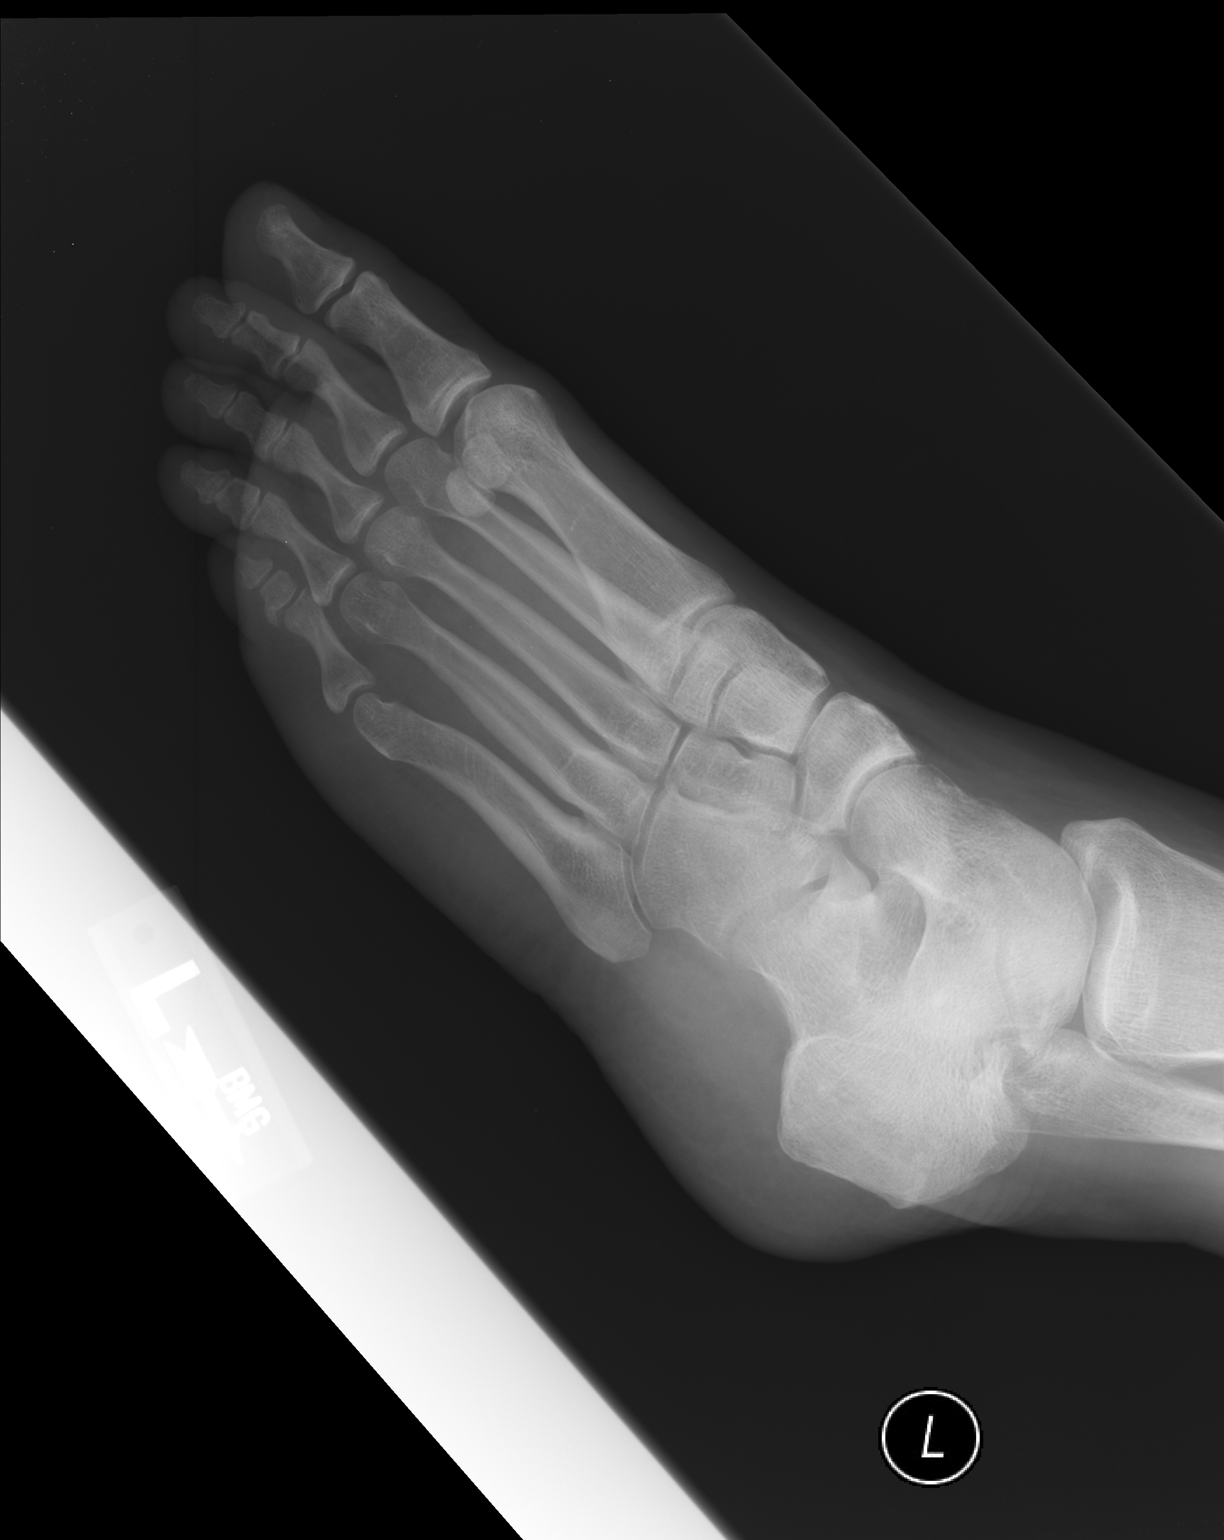

[lateral]
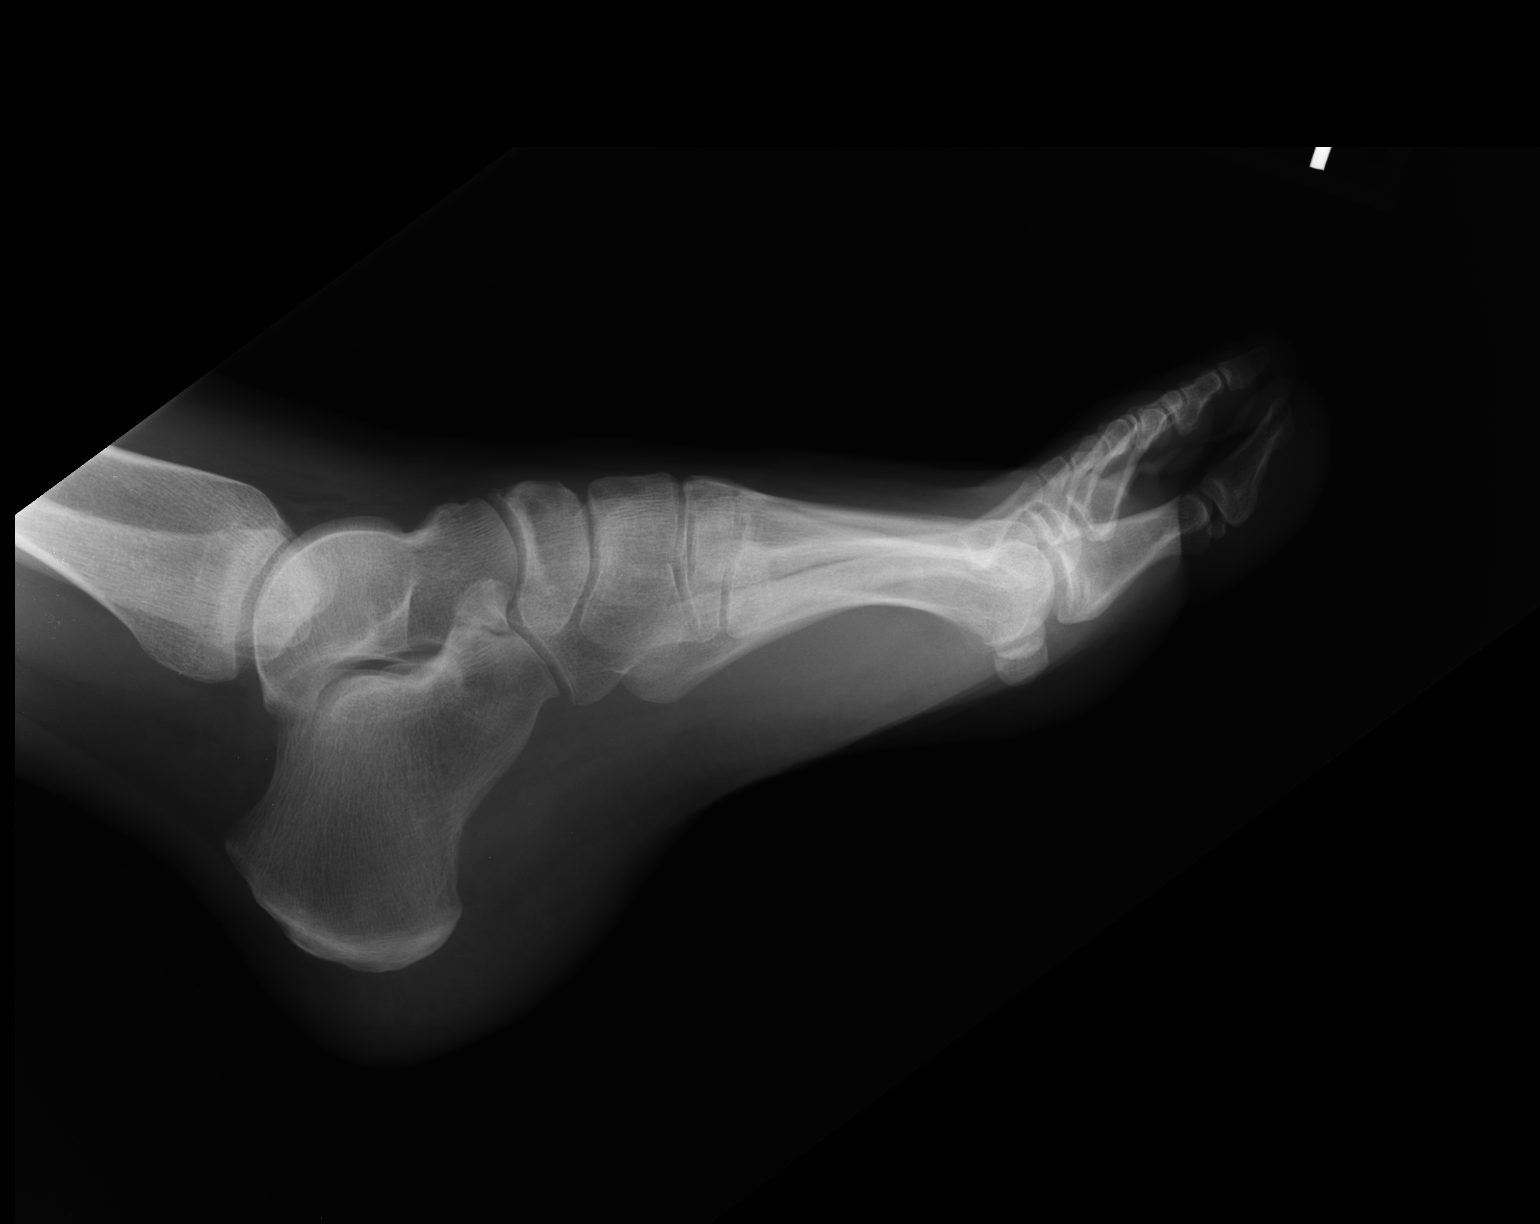

[3 of 3 positions shown; findings below may reference images not displayed]

FINDINGS: Left ankle: The ankle joint mortise is preserved. The talar dome is
intact. There is no acute or healing fracture at the ankle. There is
a fracture through the anterior process of the calcaneus.

Left foot: There is a fracture through the anterior process of the
calcaneus. The adjacent talus is intact. The other tarsal bones as
well as the metatarsals and phalanges are intact.
IMPRESSION: There is a fracture through the anterior process of the calcaneus.
The fracture line is approximately 2 mm wide. There is no periosteal
reaction. There is no acute or healing ankle fracture.

## 2015-07-09 IMAGING — CR DG ANKLE COMPLETE 3+V*L*
4 series · 4 of 4 positions shown · non-contrast
Comparison: None in PACs

CLINICAL DATA: This patient has sustained an injury of the foot and
ankle 1 month ago, initial visit

EXAM:
LEFT FOOT - COMPLETE 3+ VIEW; LEFT ANKLE COMPLETE - 3+ VIEW

[AP]
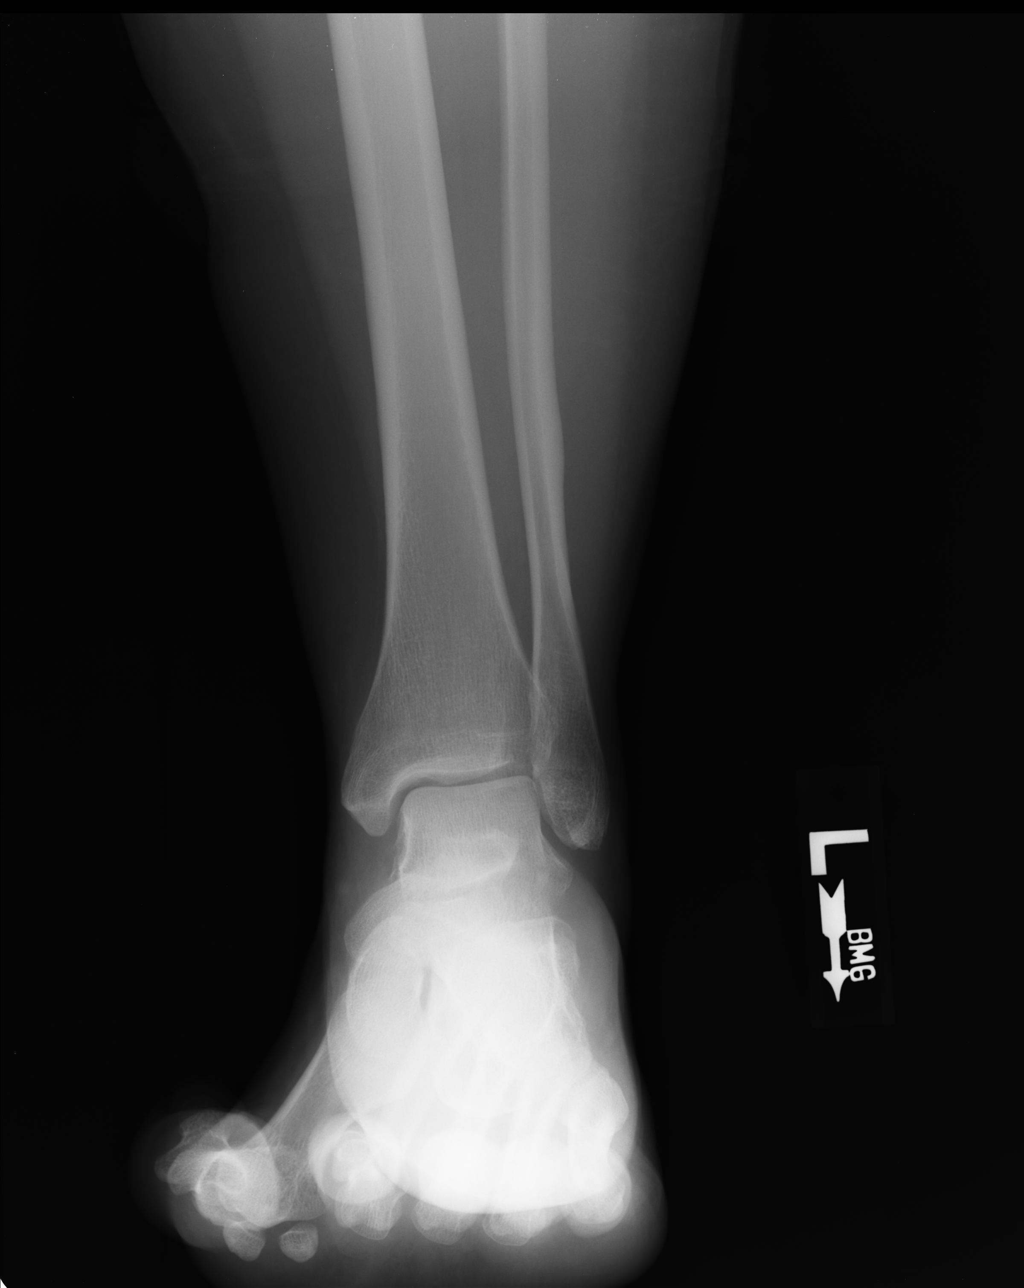

[ap obl int rot]
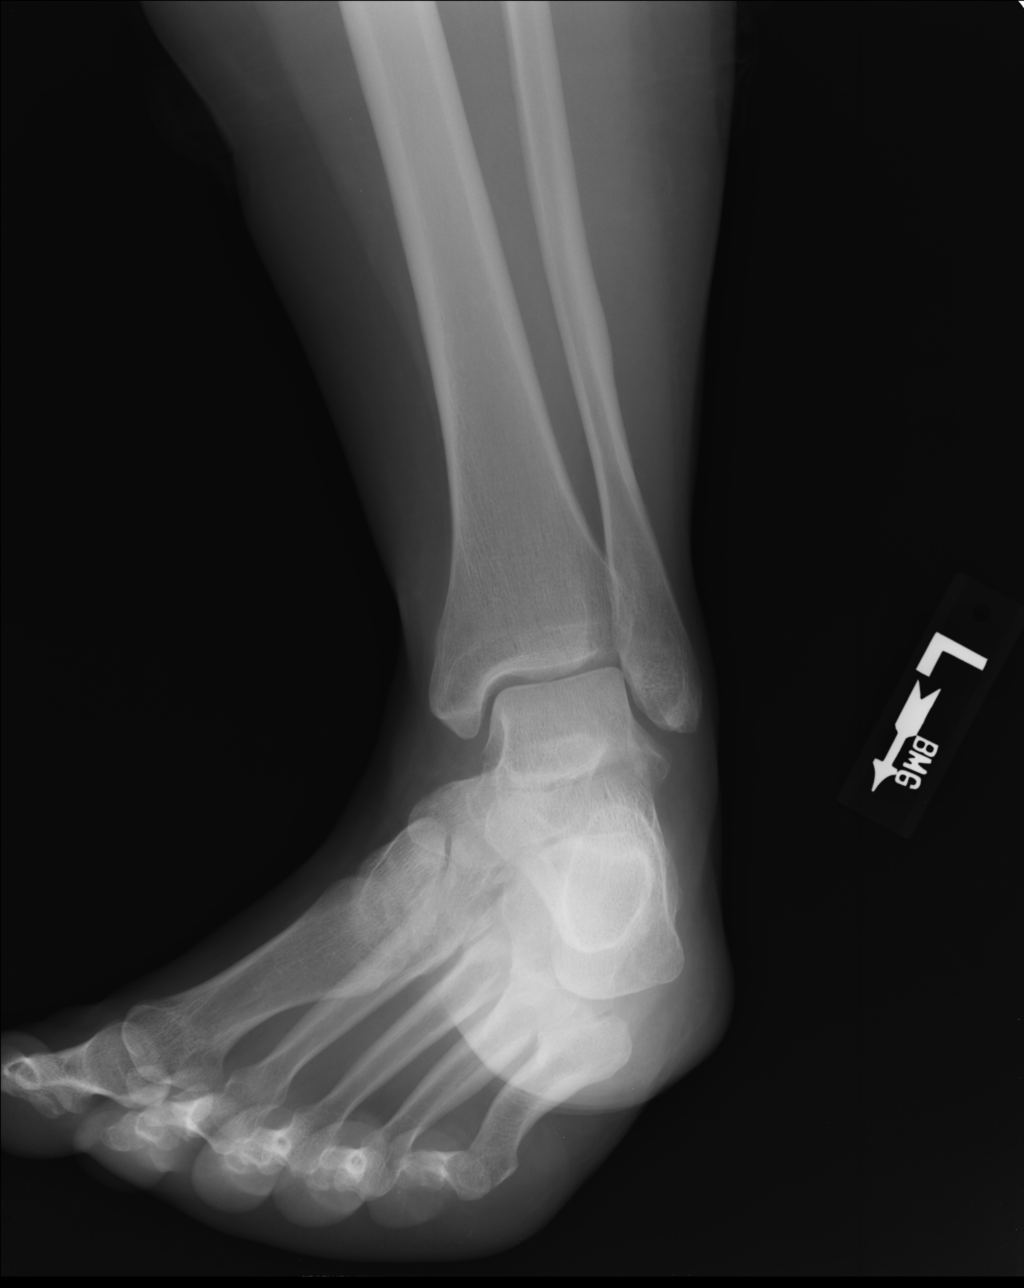

[medial obl]
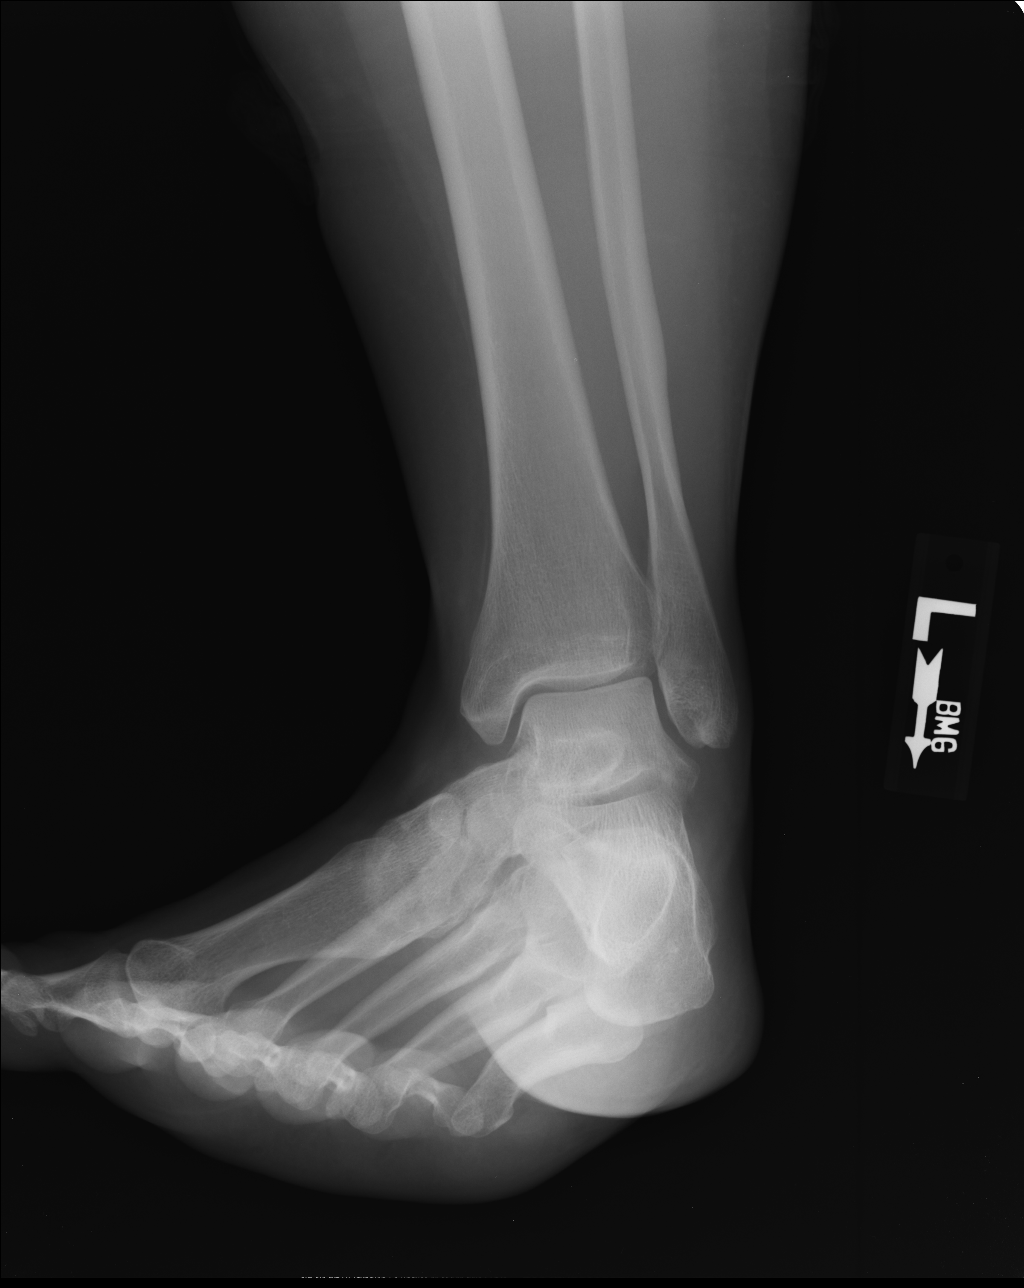

[lateral]
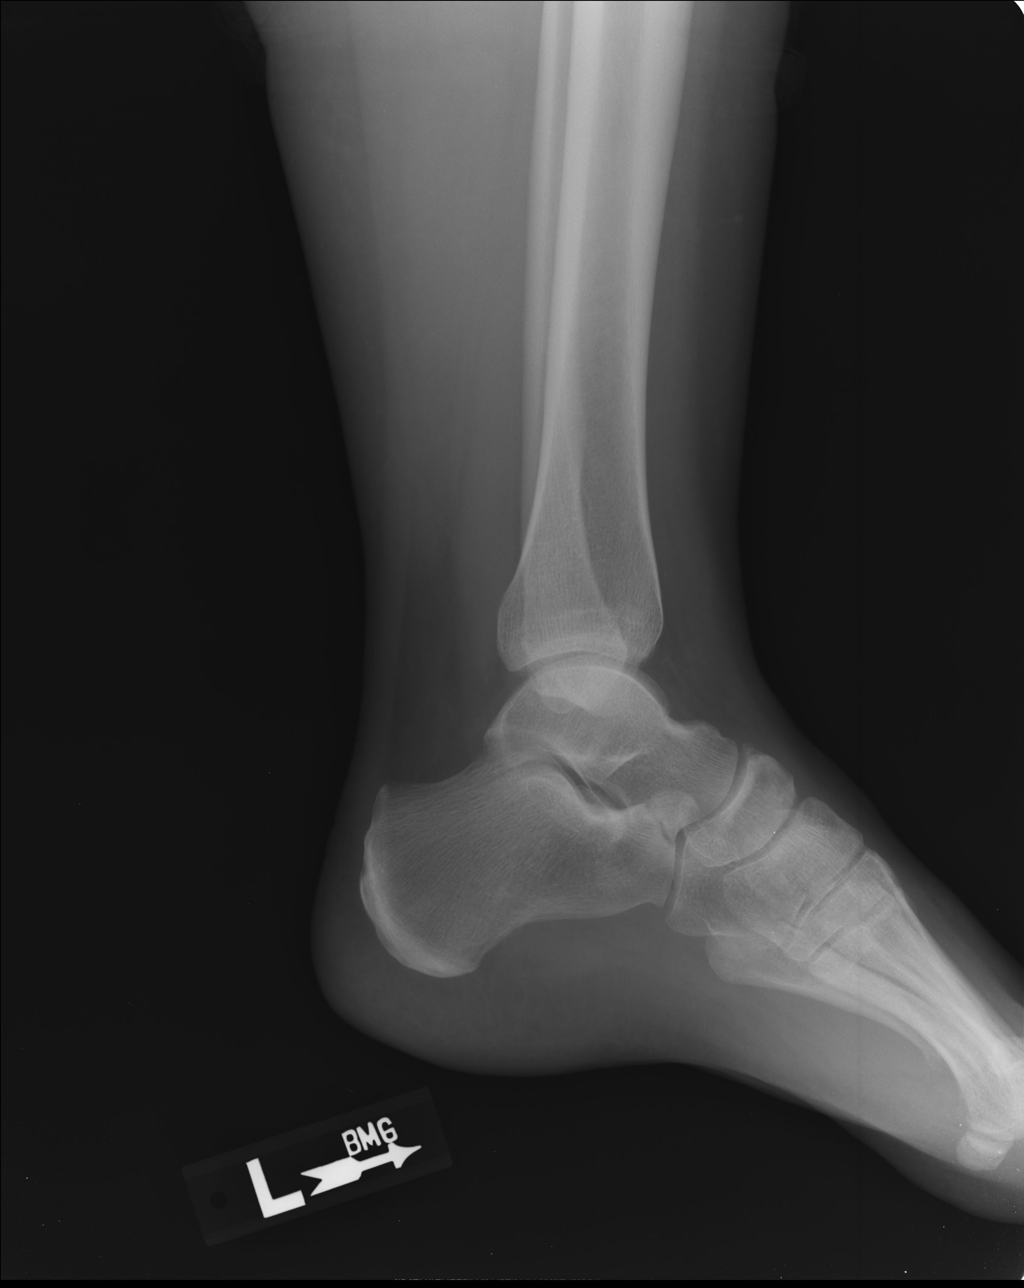

[4 of 4 positions shown; findings below may reference images not displayed]

FINDINGS: Left ankle: The ankle joint mortise is preserved. The talar dome is
intact. There is no acute or healing fracture at the ankle. There is
a fracture through the anterior process of the calcaneus.

Left foot: There is a fracture through the anterior process of the
calcaneus. The adjacent talus is intact. The other tarsal bones as
well as the metatarsals and phalanges are intact.
IMPRESSION: There is a fracture through the anterior process of the calcaneus.
The fracture line is approximately 2 mm wide. There is no periosteal
reaction. There is no acute or healing ankle fracture.

## 2015-07-09 MED ORDER — AMOXICILLIN 875 MG PO TABS: 875.0000 mg | ORAL_TABLET | Freq: Two times a day (BID) | ORAL | Status: DC

## 2015-07-09 NOTE — Progress Notes (Addendum)
This chart was scribed for Brandi ChrisSteven Nickole Adamek, MD by Stann Oresung-Kai Tsai, Medical Scribe. This patient was seen in Room 14 and the patient's care was started 10:22 AM.  Chief Complaint:  Chief Complaint  Patient presents with  . Ankle Pain    left ankle, pt fell in jun. and her ankle is not feeling better  . Sinusitis    green mucus, nasal congestion/ x 1 week    HPI: Titus MouldHeather J Foster is a 34 y.o. female [facility] complaining of left ankle pain. She injured it by falling, back in June 2016. She went to the ED for left ulnar fracture at that time, and they said that her ankle is probably just twisted. She denies getting feeling any better. She wears an ankle brace over left ankle. They did not do an xray of her left ankle back in the ED. She's currently on birth control and denies possible pregnancy.   She also notes sinus infection that started 5 days ago. She mentions that she has productive cough (green mucus) and nasal congestion. She's had sinusitis in the past, and tries to treat it herself. She denies smoking.   Past Medical History  Diagnosis Date  . Allergy   . Anxiety   . Depression    Past Surgical History  Procedure Laterality Date  . Joint replacement    . Fracture surgery     Social History   Social History  . Marital Status: Single    Spouse Name: N/A  . Number of Children: N/A  . Years of Education: N/A   Social History Main Topics  . Smoking status: Never Smoker   . Smokeless tobacco: Never Used  . Alcohol Use: Yes  . Drug Use: No  . Sexual Activity: Not Asked   Other Topics Concern  . None   Social History Narrative   History reviewed. No pertinent family history. Allergies  Allergen Reactions  . Codeine Other (See Comments)    Was told as a child she was allergic  . Sulphadimidine [Sulfamethazine] Other (See Comments)    Was told as a child she was allergic to it.    Prior to Admission medications   Medication Sig Start Date End Date  Taking? Authorizing Provider  cetirizine (ZYRTEC) 10 MG tablet Take 10 mg by mouth daily.   Yes Historical Provider, MD  LORazepam (ATIVAN) 0.5 MG tablet Take 0.5 mg by mouth every 8 (eight) hours.   Yes Historical Provider, MD  norgestimate-ethinyl estradiol (SPRINTEC 28) 0.25-35 MG-MCG tablet Take 1 tablet by mouth daily.   Yes Historical Provider, MD  sertraline (ZOLOFT) 25 MG tablet Take 25 mg by mouth daily.   Yes Historical Provider, MD  omeprazole (PRILOSEC) 20 MG capsule Take 1 capsule (20 mg total) by mouth daily. Patient not taking: Reported on 07/09/2015 09/04/14   Collene GobbleSteven A Marabelle Cushman, MD  ondansetron (ZOFRAN ODT) 4 MG disintegrating tablet Take 1 tablet (4 mg total) by mouth every 8 (eight) hours as needed for nausea or vomiting. Patient not taking: Reported on 01/11/2015 10/12/13   Elvina SidleKurt Lauenstein, MD  ondansetron [facility](ZOFRAN ODT) 8 MG disintegrating tablet 8mg  ODT q4 hours prn nausea Patient not taking: Reported on 07/09/2015 01/11/15   Dahlia ClientHannah Muthersbaugh, PA-C  oxyCODONE-acetaminophen (PERCOCET/ROXICET) 5-325 MG per tablet Take 1-2 tablets by mouth every 4 (four) hours as needed for severe pain. Patient not taking: Reported on 07/09/2015 01/11/15   Dahlia ClientHannah Muthersbaugh, PA-C  predniSONE (DELTASONE) 10 MG tablet Take 2  a day for 3 days and one a day for 3 days Patient not taking: Reported on 01/11/2015 09/04/14   Collene Gobble, MD     ROS:  Constitutional: negative for chills, fever, night sweats, weight changes, or fatigue  HEENT: negative for vision changes, hearing loss, rhinorrhea, ST, epistaxis; positive for sinus pressure, congestion Cardiovascular: negative for chest pain or palpitations Respiratory: negative for hemoptysis, wheezing, shortness of breath; positive for cough Abdominal: negative for abdominal pain, nausea, vomiting, diarrhea, or constipation Dermatological: negative for rash Neurologic: negative for headache, dizziness, or syncope Musc: positive for arthralgia (left ankle)  All  other systems reviewed and are otherwise negative with the exception to those above and in the HPI.  PHYSICAL EXAM: Filed Vitals:   07/09/15 1011  BP: 128/88  Pulse: 84  Temp: 98.5 F (36.9 C)  Resp: 16   Body mass index is 41.38 kg/(m^2).   General: Alert, no acute distress HEENT:  Normocephalic, atraumatic, oropharynx patent.Significant nasal congestion, mild redness posterior pharynx Eye: EOMI, PEERLDC Cardiovascular:  Regular rate and rhythm, no rubs murmurs or gallops.  No Carotid bruits, radial pulse intact. No pedal edema.  Respiratory: Clear to auscultation bilaterally.  No wheezes, rales, or rhonchi.  No cyanosis, no use of accessory musculature Abdominal: No organomegaly, abdomen is soft and non-tender, positive bowel sounds. No masses. Musculoskeletal: Gait intact. Left ankle: tenderness to lateral mallelus, no swelling, no instability; has an ankle brace Skin: No rashes. Neurologic: Facial musculature symmetric. Psychiatric: Patient acts appropriately throughout our interaction.  Lymphatic: No cervical or submandibular lymphadenopathy Genitourinary/Anorectal: No acute findings  LABS:  Results for orders placed or performed in visit on 07/09/15  POCT rapid strep A  Result Value Ref Range   Rapid Strep A Screen Negative Negative    EKG/XRAY:   Primary read interpreted by Dr. Cleta Alberts at [facility]: xray left foot: has anterior calcaneal process fracture    ASSESSMENT/PLAN: Patient placed on amoxicillin for 10 days for sinusitis. Referral made to orthopedics for evaluation of the abnormal film of her calcaneus.I personally performed the services described in this documentation, which was scribed in my presence. The recorded information has been reviewed and is accurate.  By signing my name below, I, Stann Ore, attest that this documentation has been prepared under the direction and in the presence of Brandi Chris, MD. Electronically Signed: Stann Ore, Scribe.  07/09/2015 , 10:22 AM .    Michaell Cowing sideeffects, risk and benefits, and alternatives of medications d/w patient. Patient is aware that all medications have potential sideeffects and we are unable to predict every sideeffect or drug-drug interaction that may occur.  Brandi Chris MD 07/09/2015 10:22 AM

## 2015-07-09 NOTE — Patient Instructions (Signed)

## 2015-09-22 ENCOUNTER — Other Ambulatory Visit: Payer: Self-pay | Admitting: Orthopedic Surgery

## 2015-09-22 DIAGNOSIS — M79672 Pain in left foot: Secondary | ICD-10-CM

## 2015-09-30 ENCOUNTER — Ambulatory Visit
Admission: RE | Admit: 2015-09-30 | Discharge: 2015-09-30 | Disposition: A | Discharge status: Home / Self Care | Payer: 59 | Source: Ambulatory Visit | Attending: Orthopedic Surgery | Admitting: Orthopedic Surgery

## 2015-09-30 DIAGNOSIS — M79672 Pain in left foot: Secondary | ICD-10-CM

## 2015-09-30 IMAGING — CT CT FOOT*L* W/O CM
3 of 4 series · 13 of 20 positions shown, 15 images · non-contrast
Comparison: [DATE]

CLINICAL DATA: Left foot pain and heel pain.  Injury [DATE]

EXAM:
CT OF THE LEFT FOOT WITHOUT CONTRAST
TECHNIQUE: Multidetector CT imaging of the left foot was performed according to
the standard protocol. Multiplanar CT image reconstructions were
also generated.

[Series 5: lower ext soft · axial · 0.48mm/px · z∈[-112,+18]mm · 5 of 79 slices shown]
[im 14/79  soft-tissue]
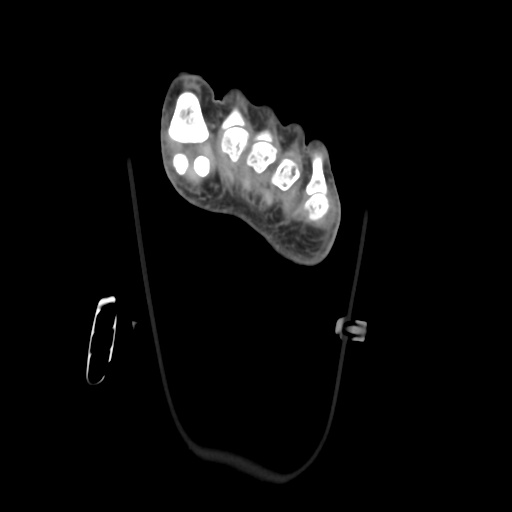
[im 27/79  soft-tissue]
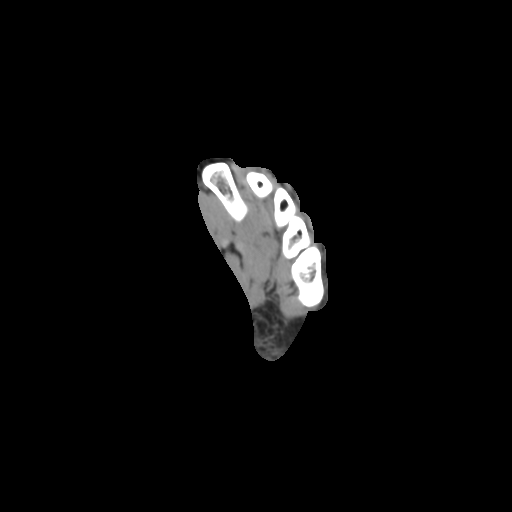
[im 40/79  soft-tissue]
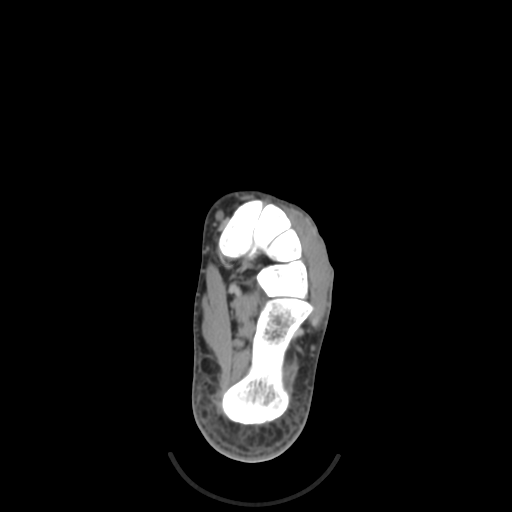
[im 53/79  soft-tissue]
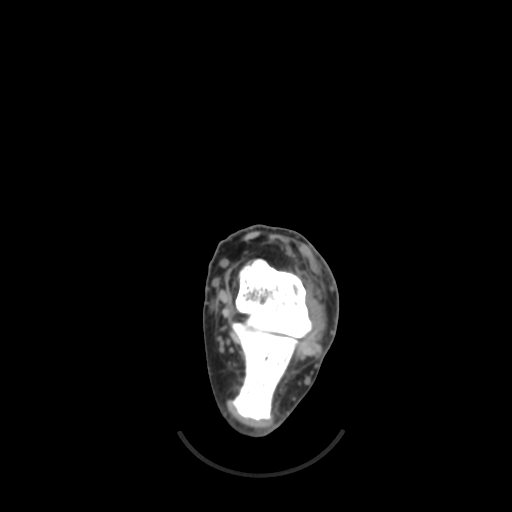
[im 66/79  soft-tissue]
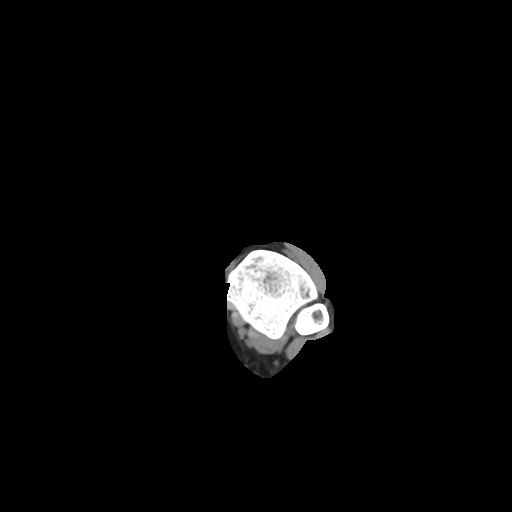

[Series 301: cor soft 2 · axial · 0.47mm/px · z∈[-153,-69]mm · 5 of 78 slices shown, 7 images]
[im 13/78  soft-tissue]
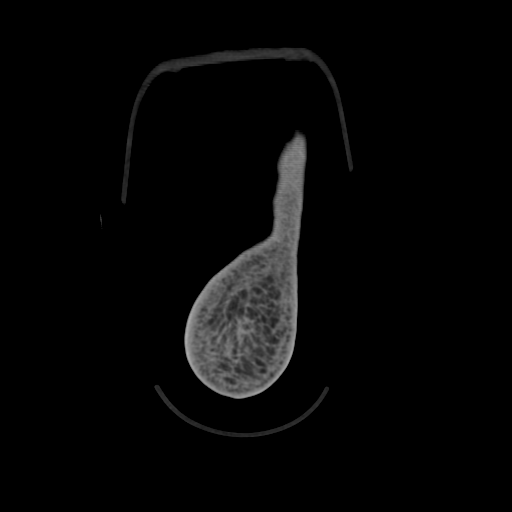
[im 13/78  bone]
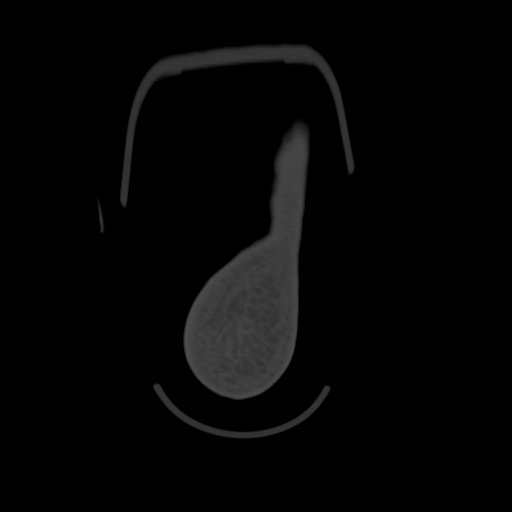
[im 26/78  bone]
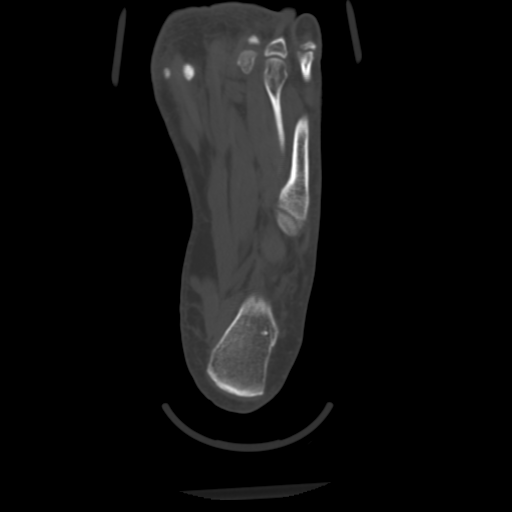
[im 39/78  bone]
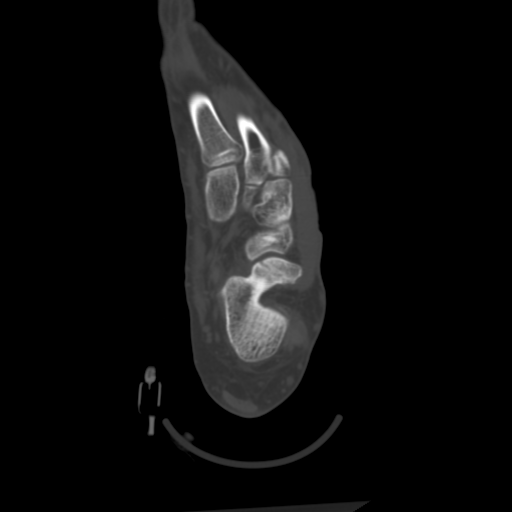
[im 52/78  bone]
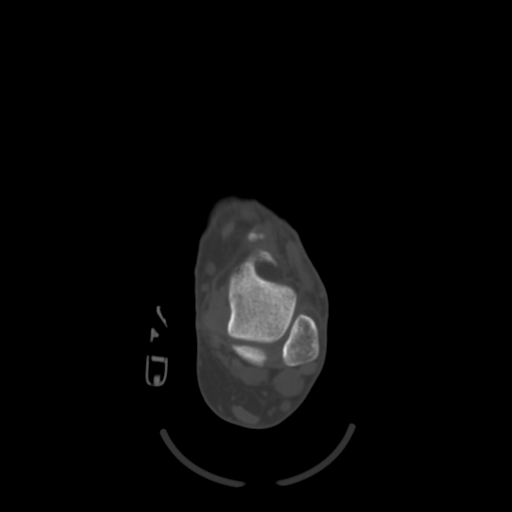
[im 65/78  soft-tissue]
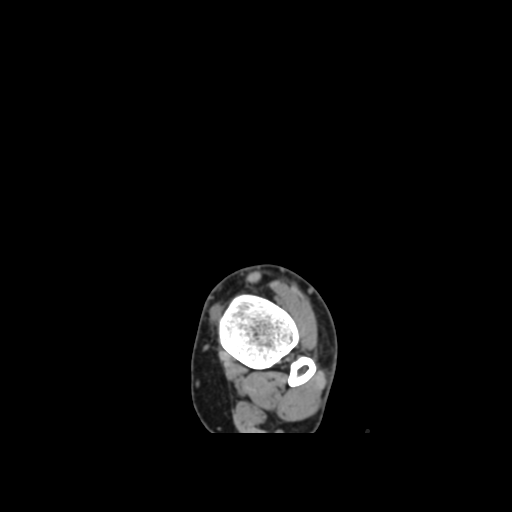
[im 65/78  bone]
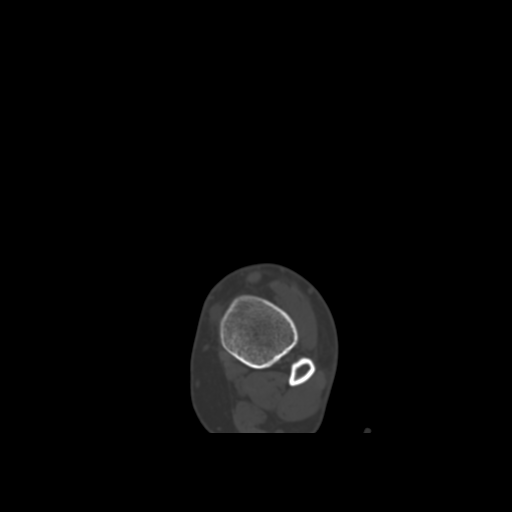

[Series 302: axial soft · coronal · 0.47mm/px · 3 of 119 slices shown]
[im 29/119  bone]
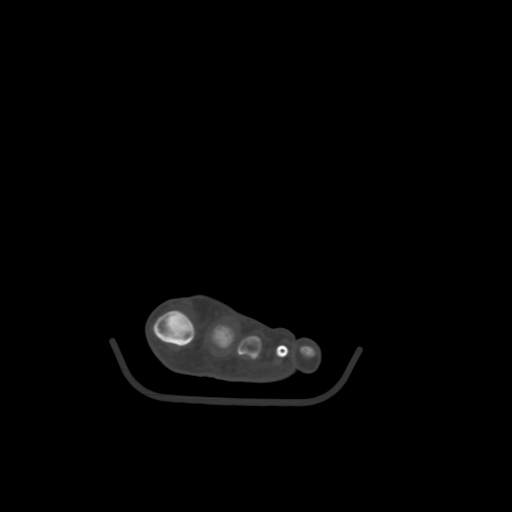
[im 59/119  bone]
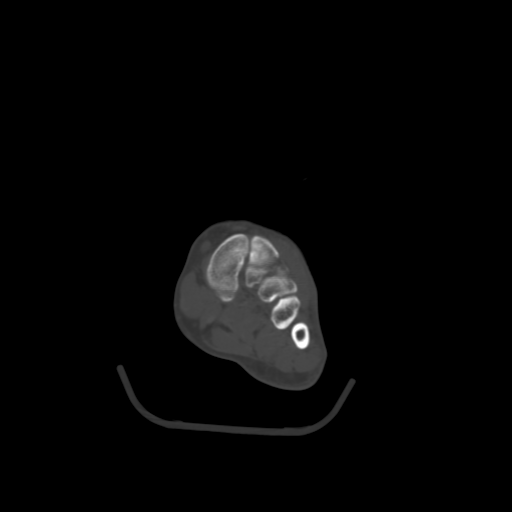
[im 90/119  bone]
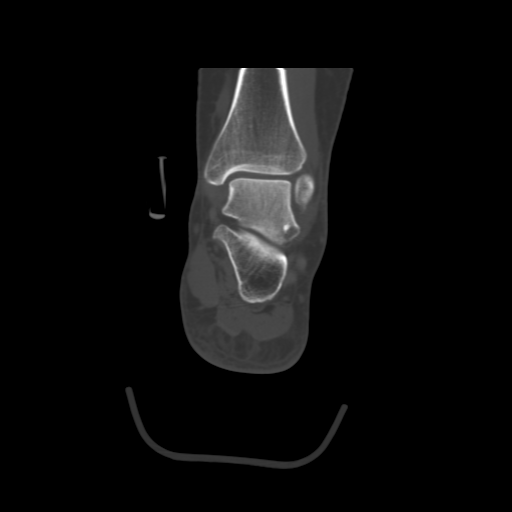

[13 of 20 positions shown; findings below may reference images not displayed]

FINDINGS: Nonunited fracture the anterior process of the calcaneus, images
30-39 of series 203, with cortication along the fracture margins.
This fracture plane extends into the calcaneocuboid articulation.

No bony lesion along the tibiotalar joint or posterior subtalar
joint. Mild spurring of the proximal -dorsal navicular.

No malalignment at the Lisfranc joint. No other fracture observed.
The poor definition of the anterior talofibular ligament. MRI is
more suitable to assessing ligamentous structures.

No additional significant findings.
IMPRESSION: 1. Chronic nonunited fracture the anterior process of the calcaneus.
2. Attenuated anterior talofibular ligament, possible chronic tear.
MRI is more sensitive and specific in assessing ligamentous
structures.
3. Mild spurring of the dorsal navicular.

## 2016-12-18 ENCOUNTER — Ambulatory Visit (INDEPENDENT_AMBULATORY_CARE_PROVIDER_SITE_OTHER): Payer: PRIVATE HEALTH INSURANCE | Admitting: Family Medicine

## 2016-12-18 ENCOUNTER — Encounter: Payer: Self-pay | Admitting: Family Medicine

## 2016-12-18 VITALS — BP 129/85 | HR 78 | Temp 98.0°F | Resp 18 | Ht 62.75 in | Wt 251.8 lb

## 2016-12-18 DIAGNOSIS — M79671 Pain in right foot: Secondary | ICD-10-CM

## 2016-12-18 MED ORDER — MELOXICAM 15 MG PO TABS: 15.0000 mg | ORAL_TABLET | Freq: Every day | ORAL | 0 refills | Status: DC | PRN

## 2016-12-18 NOTE — Assessment & Plan Note (Signed)
Appears she may have stretched the anterior talotibial ligament or posterior tib. Clinical exam not high for fracture.  - has a cam walker that she will wear.  - ice and elevation.  - mobic  - f/u in one week. Xray if no improvement and refer to sports med for ultrasound.

## 2016-12-18 NOTE — Patient Instructions (Addendum)
  Thank you for coming in,   Wear the boot this week.   Elevated, ice and take mobic as needed for pain.   Please follow up in one week with me if you are not having any improvement.    Please feel free to call with any questions or concerns at any time, at [phone]. --Dr. Jordan LikesSchmitz    IF you received an x-ray today, you will receive an invoice from [facility]. Please contact [facility] at [phone] with questions or concerns regarding your invoice.   IF you received labwork today, you will receive an invoice from [facility]. Please contact [facility] at [phone] with questions or concerns regarding your invoice.   Our billing staff will not be able to assist you with questions regarding bills from these companies.  You will be contacted with the lab results as soon as they are available. The fastest way to get your results is to activate your My Chart account. Instructions are located on the last page of this paperwork. If you have not heard from us regarding the results in 2 weeks, please contact this office.

## 2016-12-18 NOTE — Progress Notes (Signed)
  Brandi Foster - 36 y.o. female MRN [id number]  Date of birth: 09/30/1980  SUBJECTIVE:  Including CC & ROS.  Chief Complaint  Patient presents with  . Foot Injury    right foot x1day     Ms. Brandi Foster is a 36 yo F that is presenting with right foot pain. She fell yesterday while she was hiking. Had pain while walking the path back to the car. She has been using ice and ibuprofen for pain. She is able to walk. Has a fracture in her heel of her left foot than is continuing to improve so her right foot is her dominant foot.   ROS: No unexpected weight loss, fever, chills, swelling, instability, muscle pain, numbness/tingling, redness, otherwise see HPI   HISTORY: Past Medical, Surgical, Social, and Family History Reviewed & Updated per EMR.   Pertinent Historical Findings include: PMHx: right heel fracture  Surgical: left elbow.    Social:  No tobacco use   DATA REVIEWED: none  PHYSICAL EXAM:  VS: BP 129/85   Pulse 78   Temp 98 F (36.7 C) (Oral)   Resp 18   Ht 5' 2.75" (1.594 m)   Wt 251 lb 12.8 oz (114.2 kg)   LMP 12/04/2016   SpO2 97%   BMI 44.96 kg/m  PHYSICAL EXAM: Gen: NAD, alert, cooperative with exam, well-appearing HEENT: clear conjunctiva, EOMI CV:  no edema, capillary refill brisk,  Resp: non-labored, normal speech Skin: no rashes, normal turgor  Neuro: no gross deficits.  Psych:  alert and oriented MSK:  Right foot:  Ecchymosis on the medial aspect overlying the navicular.  Normal ankle ROM  TTP over the posterior tib  No TTP over the navicular  No TTP over the medial or lateral malleolus  No TTP over base of 5th MT Able to walk 4 steps  Unable to rise on her tip toes but unable to rise on her left foot due to history of fracture  Normal strength to resistance.  Some translation with anterior drawer  Negative talar tilt  Neurovascularly intact   ASSESSMENT & PLAN:   Right foot pain Appears she may have stretched the anterior talotibial ligament or  posterior tib. Clinical exam not high for fracture.  - has a cam walker that she will wear.  - ice and elevation.  - mobic  - f/u in one week. Xray if no improvement and refer to sports med for ultrasound.

## 2016-12-28 ENCOUNTER — Ambulatory Visit (INDEPENDENT_AMBULATORY_CARE_PROVIDER_SITE_OTHER): Payer: PRIVATE HEALTH INSURANCE | Admitting: Sports Medicine

## 2016-12-28 ENCOUNTER — Encounter: Payer: Self-pay | Admitting: Sports Medicine

## 2016-12-28 VITALS — BP 132/88 | Ht 63.0 in | Wt 250.0 lb

## 2016-12-28 DIAGNOSIS — M25579 Pain in unspecified ankle and joints of unspecified foot: Secondary | ICD-10-CM | POA: Diagnosis not present

## 2016-12-28 NOTE — Progress Notes (Signed)
Brandi Foster - 36 y.o. female MRN [id number]  Date of birth: 08/19/1980  SUBJECTIVE:  Including CC & ROS.  CC: bilateral ankle pain  Brandi Foster is a 36 yo female presenting with bilateral ankle pain. She injured her right foot while hiking 10 days ago. She tripped over a root while hiking and while she was being helped up, she twisted/everted her foot in a weird position. She presented to [facility] clinic on 5/14, was placed in CAM boot for a suspected anterior talotibial ligament injury. Since that time, the pain in her right foot has improved significantly; the bruising and swelling have improved and she is able to bear weight, walk a few steps with minimal pain. She has been taking tylenol.   Since wearing the CAM boot, her left foot has been starting to hurt more. Her left foot was initially injured in June 2016 (fell off a curb, resulted in calcaneus fracture, fractured left ulna, ankle sprain). Since that time, she has not had complete resolution of pain. She wore an ankle brace for 6 months before calcaneus fracture was diagnosed, then she wore a CAM boot for 4 months, then wore a brace for another 2 months before being told to stop using the brace in order to help her ankle heal. She has also tried PT for 3 months, which significantly improved pain and function, then she gradually stopped doing exercises and pain returned. She has seen multiple orthopedic doctors, including Dr. Victorino Dike, and has also gotten a cortisone shot to see if it would improve pain. She has gained 50 lbs since this injury given limited mobility.    ROS: No fever, chills, swelling, instability, muscle pain, numbness/tingling, redness, otherwise see HPI   PMHx - Updated and reviewed.  Contributory factors include: right heel fracture PSHx - Updated and reviewed.  Contributory factors include:  left elbow FHx - Updated and reviewed.  Contributory factors include:  Negative Social Hx - Updated and reviewed.  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  Psych:  alert and oriented  MSK:  Right foot:  STS on the medial aspect overlying the navicular, no ecchymosis or erythema.  Normal ankle ROM  No TTP over the posterior tib  No TTP over the navicular  No TTP over the medial or lateral malleolus  No TTP over base of 5th MT Able to rise on her tip toes when on both feet of  Normal strength to resistance.  Intact anterior drawer, Negative talar tilt  Neurovascularly intact   Left foot: Mild effusion, no ecchymosis or erythema.  Normal ankle ROM  No TTP over the posterior tib  No TTP over the navicular  No TTP over the medial or lateral malleolus  No TTP over base of 5th MT Able to walk 4 steps, waddling gaibut unable to rit Able to rise on her tip toes when on both feet of  Normal strength to resistance.  Intact anterior drawer, Negative talar tilt  Neurovascularly intact   Pes planus, collapsed longitudinal and transverse arches with standing. Able to walk 4  steps, antalgic gait (reports that she is afraid of falling as her balance is off)  ASSESSMENT & PLAN:  [name] is a 36 yo female presenting left chronic ankle pain since injury 2 years ago and new onset right ankle pain after recent fall.   Right ankle pain is likely from sprain of anterior talotibial ligament; reassured by the fact that the pain has improved greatly, she is able to bear weight without pain, and is able to stand on toes, less concerned about posterior tibialis  injury.  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 She has seen orthopedic surgery and does not appear to be a candidate for surgical repair. Will work on functionality and pain improvement. Plan: - [facility] - use body helix with exercises for stability  Follow up in 4 weeks. Will consider orthopedics at next visit to see if it can improve stabilization and support ankles  Patient seen and evaluated with the resident. I agree with the above plan of care. We are going to transition her from her Cam Walker into a med spec brace for her acutely injured right ankle. She was provided with crutches to help assist with ambulation. For her chronic left ankle pain and swelling she will try a body helix compression sleeve. She has had good success with physical therapy in the past so we will refer her back to PT and she will follow-up with us in 4 weeks. I think this patient would do well with orthotics so we will plan on either temporary or possibly custom inserts once she is over her acute right ankle injury. She will call with questions or concerns prior to her follow-up visit.

## 2017-02-06 ENCOUNTER — Encounter: Payer: PRIVATE HEALTH INSURANCE | Admitting: Sports Medicine

## 2017-02-08 ENCOUNTER — Ambulatory Visit (INDEPENDENT_AMBULATORY_CARE_PROVIDER_SITE_OTHER): Payer: PRIVATE HEALTH INSURANCE | Admitting: Sports Medicine

## 2017-02-08 ENCOUNTER — Encounter (INDEPENDENT_AMBULATORY_CARE_PROVIDER_SITE_OTHER): Payer: Self-pay

## 2017-02-08 ENCOUNTER — Encounter: Payer: Self-pay | Admitting: Sports Medicine

## 2017-02-08 VITALS — BP 130/86

## 2017-02-08 DIAGNOSIS — M25579 Pain in unspecified ankle and joints of unspecified foot: Secondary | ICD-10-CM | POA: Diagnosis not present

## 2017-02-09 NOTE — Progress Notes (Signed)
   Subjective:    Patient ID: Brandi Foster, female    DOB: Apr 19, 1981, 36 y.o.   MRN: [id number]  HPI   Patient comes in today for follow-up on bilateral ankle pain. Right ankle is doing much better. She is still wearing her med spec brace intermittently. Her left ankle pain is chronic. Primarily along the lateral ankle and foot. She is progressing well with physical therapy. She has discontinued using her body helix compression sleeve.    Review of Systems     Objective:   Physical Exam  Well-developed, well-nourished. No acute distress  Right ankle: Full range of motion. No effusion. No soft tissue swelling. No tenderness to palpation. Negative anterior drawer, negative talar tilt. Neurovascularly intact distally.  Left ankle: Full range of motion. No effusion. No soft tissue swelling. Slight tenderness to palpation along the peroneal tendons. Good strength. Neurovascularly intact distally.     Assessment & Plan:   Resolved right ankle sprain Chronic left ankle pain with history of nonunited calcaneus fracture  Patient will continue with physical therapy weaning to a home exercise program per the therapist's discretion. She will discontinue her med spec brace except when walking on uneven surfaces such as when working in the yard. She is not interested in orthotics. She may continue to increase activity as tolerated. Follow-up with me as needed.

## 2017-09-20 DIAGNOSIS — Z1329 Encounter for screening for other suspected endocrine disorder: Secondary | ICD-10-CM | POA: Diagnosis not present

## 2017-09-20 DIAGNOSIS — Z01419 Encounter for gynecological examination (general) (routine) without abnormal findings: Secondary | ICD-10-CM | POA: Diagnosis not present

## 2017-09-20 DIAGNOSIS — Z13220 Encounter for screening for lipoid disorders: Secondary | ICD-10-CM | POA: Diagnosis not present

## 2017-09-20 DIAGNOSIS — Z124 Encounter for screening for malignant neoplasm of cervix: Secondary | ICD-10-CM | POA: Diagnosis not present

## 2017-10-22 DIAGNOSIS — N920 Excessive and frequent menstruation with regular cycle: Secondary | ICD-10-CM | POA: Diagnosis not present

## 2017-10-26 ENCOUNTER — Encounter: Payer: Self-pay | Admitting: Emergency Medicine

## 2017-10-26 ENCOUNTER — Ambulatory Visit: Payer: 59 | Admitting: Emergency Medicine

## 2017-10-26 VITALS — BP 140/71 | HR 65 | Temp 97.5°F | Resp 17 | Ht 63.5 in | Wt 256.0 lb

## 2017-10-26 DIAGNOSIS — M62838 Other muscle spasm: Secondary | ICD-10-CM | POA: Diagnosis not present

## 2017-10-26 DIAGNOSIS — M545 Low back pain, unspecified: Secondary | ICD-10-CM | POA: Insufficient documentation

## 2017-10-26 DIAGNOSIS — M7918 Myalgia, other site: Secondary | ICD-10-CM

## 2017-10-26 MED ORDER — CYCLOBENZAPRINE HCL 10 MG PO TABS: 10.0000 mg | ORAL_TABLET | Freq: Three times a day (TID) | ORAL | 0 refills | Status: DC | PRN

## 2017-10-26 MED ORDER — DICLOFENAC SODIUM 75 MG PO TBEC: 75.0000 mg | DELAYED_RELEASE_TABLET | Freq: Two times a day (BID) | ORAL | 0 refills | Status: AC

## 2017-10-26 MED ORDER — METHYLPREDNISOLONE ACETATE 80 MG/ML IJ SUSP
80.0000 mg | Freq: Once | INTRAMUSCULAR | Status: AC
  Administered 2017-10-26: 80 mg via INTRAMUSCULAR

## 2017-10-26 NOTE — Progress Notes (Signed)
Brandi AlbertHeather J Foster 37 y.o.   Chief Complaint  Patient presents with  . Back Pain    HISTORY OF PRESENT ILLNESS: This is a 37 y.o. female complaining of low back pain that started 3-4 days ago.  Denies injury.  Back Pain  This is a new problem. The current episode started in the past 7 days. The problem occurs constantly. The problem has been gradually worsening since onset. The pain is present in the lumbar spine. The quality of the pain is described as aching. The pain does not radiate. The pain is at a severity of 6/10. The pain is moderate. The pain is the same all the time. The symptoms are aggravated by bending and position. Pertinent negatives include no abdominal pain, bladder incontinence, bowel incontinence, chest pain, dysuria, fever, headaches, leg pain, numbness, paresis, paresthesias, pelvic pain, perianal numbness, tingling, weakness or weight loss. Risk factors: None. She has tried heat, ice, NSAIDs and analgesics for the symptoms. The treatment provided mild relief.     Prior to Admission medications   Medication Sig Start Date End Date Taking? Authorizing Provider  cetirizine (ZYRTEC) 10 MG tablet Take 10 mg by mouth daily.    [provider]  LORazepam (ATIVAN) 0.5 MG tablet Take 0.5 mg by mouth every 8 (eight) hours.    [provider]  meloxicam (MOBIC) 15 MG tablet Take 1 tablet (15 mg total) by mouth daily as needed for pain. 12/18/16   Myra RudeSchmitz, Jeremy E, MD  norgestimate-ethinyl estradiol (SPRINTEC 28) 0.25-35 MG-MCG tablet Take 1 tablet by mouth daily.    [provider]  omeprazole (PRILOSEC) 20 MG capsule Take 1 capsule (20 mg total) by mouth daily. Patient not taking: Reported on 07/09/2015 09/04/14   Collene Gobbleaub, Steven A, MD  ondansetron (ZOFRAN ODT) 4 MG disintegrating tablet Take 1 tablet (4 mg total) by mouth every 8 (eight) hours as needed for nausea or vomiting. Patient not taking: Reported on 01/11/2015 10/12/13   Elvina SidleLauenstein, Kurt, MD    ondansetron [facility](ZOFRAN ODT) 8 MG disintegrating tablet 8mg  ODT q4 hours prn nausea Patient not taking: Reported on 07/09/2015 01/11/15   Muthersbaugh, Dahlia ClientHannah, PA-C  oxyCODONE-acetaminophen (PERCOCET/ROXICET) 5-325 MG per tablet Take 1-2 tablets by mouth every 4 (four) hours as needed for severe pain. Patient not taking: Reported on 07/09/2015 01/11/15   Muthersbaugh, Dahlia ClientHannah, PA-C  predniSONE (DELTASONE) 10 MG tablet Take 2 a day for 3 days and one a day for 3 days Patient not taking: Reported on 01/11/2015 09/04/14   Collene Gobbleaub, Steven A, MD  sertraline (ZOLOFT) 25 MG tablet Take 25 mg by mouth daily.    [provider]    Allergies  Allergen Reactions  . Codeine Other (See Comments)    Was told as a child she was allergic  . Sulphadimidine [Sulfamethazine] Other (See Comments)    Was told as a child she was allergic to it.     Patient Active Problem List   Diagnosis Date Noted  . Right foot pain 12/18/2016    Past Medical History:  Diagnosis Date  . Allergy   . Anxiety   . Depression     Past Surgical History:  Procedure Laterality Date  . FRACTURE SURGERY    . JOINT REPLACEMENT      Social History   Socioeconomic History  . Marital status: Single    Spouse name: Not on file  . Number of children: Not on file  . Years of education: Not on file  . Highest  education level: Not on file  Occupational History  . Not on file  Social Needs  . Financial resource strain: Not on file  . Food insecurity:    Worry: Not on file    Inability: Not on file  . Transportation needs:    Medical: Not on file    Non-medical: Not on file  Tobacco Use  . Smoking status: Never Smoker  . Smokeless tobacco: Never Used  Substance and Sexual Activity  . Alcohol use: Yes  . Drug use: No  . Sexual activity: Not on file  Lifestyle  . Physical activity:    Days per week: Not on file    Minutes per session: Not on file  . Stress: Not on file  Relationships  . Social connections:    Talks  on phone: Not on file    Gets together: Not on file    Attends religious service: Not on file    Active member of club or organization: Not on file    Attends meetings of clubs or organizations: Not on file    Relationship status: Not on file  . Intimate partner violence:    Fear of current or ex partner: Not on file    Emotionally abused: Not on file    Physically abused: Not on file    Forced sexual activity: Not on file  Other Topics Concern  . Not on file  Social History Narrative  . Not on file    No family history on file.   Review of Systems  Constitutional: Negative.  Negative for fever, malaise/fatigue and weight loss.  HENT: Negative.   Eyes: Negative.   Respiratory: Negative.  Negative for cough and shortness of breath.   Cardiovascular: Negative.  Negative for chest pain and palpitations.  Gastrointestinal: Negative.  Negative for abdominal pain, bowel incontinence, nausea and vomiting.  Genitourinary: Negative for bladder incontinence, dysuria, flank pain, frequency, hematuria, pelvic pain and urgency.  Musculoskeletal: Positive for back pain. Negative for myalgias and neck pain.  Skin: Negative.  Negative for rash.  Neurological: Negative.  Negative for tingling, sensory change, focal weakness, weakness, numbness, headaches and paresthesias.  Endo/Heme/Allergies: Negative.   All other systems reviewed and are negative.  Vitals:   10/26/17 1211  BP: 140/71  Pulse: 65  Resp: 17  Temp: (!) 97.5 F (36.4 C)  SpO2: 98%     Physical Exam  Constitutional: She is oriented to person, place, and time. She appears well-developed and well-nourished.  HENT:  Head: Normocephalic and atraumatic.  Nose: Nose normal.  Mouth/Throat: Oropharynx is clear and moist.  Eyes: Pupils are equal, round, and reactive to light. Conjunctivae and EOM are normal.  Neck: Normal range of motion. Neck supple.  Cardiovascular: Normal rate, regular rhythm and normal heart sounds.    Pulmonary/Chest: Effort normal and breath sounds normal.  Abdominal: Soft. Bowel sounds are normal. She exhibits no distension. There is no tenderness.  Musculoskeletal: She exhibits no edema.       Lumbar back: She exhibits decreased range of motion, tenderness and spasm. She exhibits no bony tenderness, no swelling and normal pulse.  Lymphadenopathy:    She has no cervical adenopathy.  Neurological: She is alert and oriented to person, place, and time. She displays normal reflexes. No sensory deficit. She exhibits normal muscle tone. Coordination normal.  Skin: Skin is warm and dry. Capillary refill takes less than 2 seconds. No rash noted.  Psychiatric: She has a normal mood and affect. Her behavior  is normal.  Vitals reviewed.    ASSESSMENT & PLAN: Saran was seen today for back pain.  Diagnoses and all orders for this visit:  Lumbar pain -     methylPREDNISolone acetate (DEPO-MEDROL) injection 80 mg -     diclofenac (VOLTAREN) 75 MG EC tablet; Take 1 tablet (75 mg total) by mouth 2 (two) times daily for 5 days. -     cyclobenzaprine (FLEXERIL) 10 MG tablet; Take 1 tablet (10 mg total) by mouth 3 (three) times daily as needed for muscle spasms.  Muscle spasm  Musculoskeletal pain    Patient Instructions       IF you received an x-ray today, you will receive an invoice from [facility]. Please contact [facility] at [phone] with questions or concerns regarding your invoice.   IF you received labwork today, you will receive an invoice from [facility]. Please contact [facility] at [phone] with questions or concerns regarding your invoice.   Our billing staff will not be able to assist you with questions regarding bills from these companies.  You will be contacted with the lab results as soon as they are available. The fastest way to get your results is to activate your My Chart account. Instructions are located on the last page of this paperwork.  If you have not heard from [place] regarding the results in 2 weeks, please contact this office.      Back Pain, Adult Back pain is very common. The pain often gets better over time. The cause of back pain is usually not dangerous. Most people can learn to manage their back pain on their own. Follow these instructions at home: Watch your back pain for any changes. The following actions may help to lessen any pain you are feeling:  Stay active. Start with short walks on flat ground if you can. Try to walk farther each day.  Exercise regularly as told by your doctor. Exercise helps your back heal faster. It also helps avoid future injury by keeping your muscles strong and flexible.  Do not sit, drive, or stand in one place for more than 30 minutes.  Do not stay in bed. Resting more than 1-2 days can slow down your recovery.  Be careful when you bend or lift an object. Use good form when lifting: ? Bend at your knees. ? Keep the object close to your body. ? Do not twist.  Sleep on a firm mattress. Lie on your side, and bend your knees. If you lie on your back, put a pillow under your knees.  Take medicines only as told by your doctor.  Put ice on the injured area. ? Put ice in a plastic bag. ? Place a towel between your skin and the bag. ? Leave the ice on for 20 minutes, 2-3 times a day for the first 2-3 days. After that, you can switch between ice and heat packs.  Avoid feeling anxious or stressed. Find good ways to deal with stress, such as exercise.  Maintain a healthy weight. Extra weight puts stress on your back.  Contact a doctor if:  You have pain that does not go away with rest or medicine.  You have worsening pain that goes down into your legs or buttocks.  You have pain that does not get better in one week.  You have pain at night.  You lose weight.  You have a fever or chills. Get help right away if:  You cannot control when you poop (bowel  movement) or pee  (urinate).  Your arms or legs feel weak.  Your arms or legs lose feeling (numbness).  You feel sick to your stomach (nauseous) or throw up (vomit).  You have belly (abdominal) pain.  You feel like you may pass out (faint). This information is not intended to replace advice given to you by your health care provider. Make sure you discuss any questions you have with your health care provider. Document Released: 01/10/2008 Document Revised: 12/30/2015 Document Reviewed: 11/25/2013 Elsevier Interactive Patient Education  2018 Elsevier Inc.      Edwina Barth, MD [facility]

## 2017-10-26 NOTE — Patient Instructions (Addendum)
     IF you received an x-ray today, you will receive an invoice from [facility]. Please contact  Radiology at [phone] with questions or concerns regarding your invoice.   IF you received labwork today, you will receive an invoice from [facility]. Please contact [facility] at [phone] with questions or concerns regarding your invoice.   Our billing staff will not be able to assist you with questions regarding bills from these companies.  You will be contacted with the lab results as soon as they are available. The fastest way to get your results is to activate your My Chart account. Instructions are located on the last page of this paperwork. If you have not heard from us regarding the results in 2 weeks, please contact this office.      Back Pain, Adult Back pain is very common. The pain often gets better over time. The cause of back pain is usually not dangerous. Most people can learn to manage their back pain on their own. Follow these instructions at home: Watch your back pain for any changes. The following actions may help to lessen any pain you are feeling:  Stay active. Start with short walks on flat ground if you can. Try to walk farther each day.  Exercise regularly as told by your doctor. Exercise helps your back heal faster. It also helps avoid future injury by keeping your muscles strong and flexible.  Do not sit, drive, or stand in one place for more than 30 minutes.  Do not stay in bed. Resting more than 1-2 days can slow down your recovery.  Be careful when you bend or lift an object. Use good form when lifting: ? Bend at your knees. ? Keep the object close to your body. ? Do not twist.  Sleep on a firm mattress. Lie on your side, and bend your knees. If you lie on your back, put a pillow under your knees.  Take medicines only as told by your doctor.  Put ice on the injured area. ? Put ice in a plastic bag. ? Place a towel between your  skin and the bag. ? Leave the ice on for 20 minutes, 2-3 times a day for the first 2-3 days. After that, you can switch between ice and heat packs.  Avoid feeling anxious or stressed. Find good ways to deal with stress, such as exercise.  Maintain a healthy weight. Extra weight puts stress on your back.  Contact a doctor if:  You have pain that does not go away with rest or medicine.  You have worsening pain that goes down into your legs or buttocks.  You have pain that does not get better in one week.  You have pain at night.  You lose weight.  You have a fever or chills. Get help right away if:  You cannot control when you poop (bowel movement) or pee (urinate).  Your arms or legs feel weak.  Your arms or legs lose feeling (numbness).  You feel sick to your stomach (nauseous) or throw up (vomit).  You have belly (abdominal) pain.  You feel like you may pass out (faint). This information is not intended to replace advice given to you by your health care provider. Make sure you discuss any questions you have with your health care provider. Document Released: 01/10/2008 Document Revised: 12/30/2015 Document Reviewed: 11/25/2013 Elsevier Interactive Patient Education  2018 Elsevier Inc.  

## 2017-10-29 DIAGNOSIS — Z719 Counseling, unspecified: Secondary | ICD-10-CM | POA: Diagnosis not present

## 2017-11-07 DIAGNOSIS — Z719 Counseling, unspecified: Secondary | ICD-10-CM | POA: Diagnosis not present

## 2017-11-14 DIAGNOSIS — Z719 Counseling, unspecified: Secondary | ICD-10-CM | POA: Diagnosis not present

## 2017-11-22 DIAGNOSIS — Z719 Counseling, unspecified: Secondary | ICD-10-CM | POA: Diagnosis not present

## 2017-11-28 DIAGNOSIS — Z719 Counseling, unspecified: Secondary | ICD-10-CM | POA: Diagnosis not present

## 2017-12-03 DIAGNOSIS — N926 Irregular menstruation, unspecified: Secondary | ICD-10-CM | POA: Diagnosis not present

## 2017-12-05 DIAGNOSIS — Z719 Counseling, unspecified: Secondary | ICD-10-CM | POA: Diagnosis not present

## 2017-12-12 DIAGNOSIS — Z719 Counseling, unspecified: Secondary | ICD-10-CM | POA: Diagnosis not present

## 2017-12-20 DIAGNOSIS — Z719 Counseling, unspecified: Secondary | ICD-10-CM | POA: Diagnosis not present

## 2017-12-26 DIAGNOSIS — Z719 Counseling, unspecified: Secondary | ICD-10-CM | POA: Diagnosis not present

## 2018-01-02 DIAGNOSIS — Z719 Counseling, unspecified: Secondary | ICD-10-CM | POA: Diagnosis not present

## 2018-01-15 DIAGNOSIS — Z719 Counseling, unspecified: Secondary | ICD-10-CM | POA: Diagnosis not present

## 2018-01-17 DIAGNOSIS — Z719 Counseling, unspecified: Secondary | ICD-10-CM | POA: Diagnosis not present

## 2018-01-18 DIAGNOSIS — M9906 Segmental and somatic dysfunction of lower extremity: Secondary | ICD-10-CM | POA: Diagnosis not present

## 2018-01-18 DIAGNOSIS — M9903 Segmental and somatic dysfunction of lumbar region: Secondary | ICD-10-CM | POA: Diagnosis not present

## 2018-01-18 DIAGNOSIS — M25572 Pain in left ankle and joints of left foot: Secondary | ICD-10-CM | POA: Diagnosis not present

## 2018-01-28 DIAGNOSIS — M9906 Segmental and somatic dysfunction of lower extremity: Secondary | ICD-10-CM | POA: Diagnosis not present

## 2018-01-28 DIAGNOSIS — M9903 Segmental and somatic dysfunction of lumbar region: Secondary | ICD-10-CM | POA: Diagnosis not present

## 2018-01-28 DIAGNOSIS — M25572 Pain in left ankle and joints of left foot: Secondary | ICD-10-CM | POA: Diagnosis not present

## 2018-01-29 DIAGNOSIS — M25572 Pain in left ankle and joints of left foot: Secondary | ICD-10-CM | POA: Diagnosis not present

## 2018-01-29 DIAGNOSIS — M9906 Segmental and somatic dysfunction of lower extremity: Secondary | ICD-10-CM | POA: Diagnosis not present

## 2018-01-29 DIAGNOSIS — M9903 Segmental and somatic dysfunction of lumbar region: Secondary | ICD-10-CM | POA: Diagnosis not present

## 2018-02-01 DIAGNOSIS — M9903 Segmental and somatic dysfunction of lumbar region: Secondary | ICD-10-CM | POA: Diagnosis not present

## 2018-02-01 DIAGNOSIS — M25572 Pain in left ankle and joints of left foot: Secondary | ICD-10-CM | POA: Diagnosis not present

## 2018-02-01 DIAGNOSIS — M9906 Segmental and somatic dysfunction of lower extremity: Secondary | ICD-10-CM | POA: Diagnosis not present

## 2018-02-04 DIAGNOSIS — M25572 Pain in left ankle and joints of left foot: Secondary | ICD-10-CM | POA: Diagnosis not present

## 2018-02-04 DIAGNOSIS — M9906 Segmental and somatic dysfunction of lower extremity: Secondary | ICD-10-CM | POA: Diagnosis not present

## 2018-02-04 DIAGNOSIS — M9903 Segmental and somatic dysfunction of lumbar region: Secondary | ICD-10-CM | POA: Diagnosis not present

## 2018-02-05 DIAGNOSIS — M9906 Segmental and somatic dysfunction of lower extremity: Secondary | ICD-10-CM | POA: Diagnosis not present

## 2018-02-05 DIAGNOSIS — M25572 Pain in left ankle and joints of left foot: Secondary | ICD-10-CM | POA: Diagnosis not present

## 2018-02-05 DIAGNOSIS — M9903 Segmental and somatic dysfunction of lumbar region: Secondary | ICD-10-CM | POA: Diagnosis not present

## 2018-02-06 DIAGNOSIS — M9903 Segmental and somatic dysfunction of lumbar region: Secondary | ICD-10-CM | POA: Diagnosis not present

## 2018-02-06 DIAGNOSIS — M9906 Segmental and somatic dysfunction of lower extremity: Secondary | ICD-10-CM | POA: Diagnosis not present

## 2018-02-06 DIAGNOSIS — M25572 Pain in left ankle and joints of left foot: Secondary | ICD-10-CM | POA: Diagnosis not present

## 2018-02-13 DIAGNOSIS — M25572 Pain in left ankle and joints of left foot: Secondary | ICD-10-CM | POA: Diagnosis not present

## 2018-02-13 DIAGNOSIS — M9903 Segmental and somatic dysfunction of lumbar region: Secondary | ICD-10-CM | POA: Diagnosis not present

## 2018-02-13 DIAGNOSIS — M9906 Segmental and somatic dysfunction of lower extremity: Secondary | ICD-10-CM | POA: Diagnosis not present

## 2018-02-14 DIAGNOSIS — M9906 Segmental and somatic dysfunction of lower extremity: Secondary | ICD-10-CM | POA: Diagnosis not present

## 2018-02-14 DIAGNOSIS — M9903 Segmental and somatic dysfunction of lumbar region: Secondary | ICD-10-CM | POA: Diagnosis not present

## 2018-02-14 DIAGNOSIS — M25572 Pain in left ankle and joints of left foot: Secondary | ICD-10-CM | POA: Diagnosis not present

## 2018-02-18 DIAGNOSIS — M9906 Segmental and somatic dysfunction of lower extremity: Secondary | ICD-10-CM | POA: Diagnosis not present

## 2018-02-18 DIAGNOSIS — M9903 Segmental and somatic dysfunction of lumbar region: Secondary | ICD-10-CM | POA: Diagnosis not present

## 2018-02-18 DIAGNOSIS — M25572 Pain in left ankle and joints of left foot: Secondary | ICD-10-CM | POA: Diagnosis not present

## 2018-02-19 DIAGNOSIS — M9903 Segmental and somatic dysfunction of lumbar region: Secondary | ICD-10-CM | POA: Diagnosis not present

## 2018-02-19 DIAGNOSIS — Z719 Counseling, unspecified: Secondary | ICD-10-CM | POA: Diagnosis not present

## 2018-02-19 DIAGNOSIS — M9906 Segmental and somatic dysfunction of lower extremity: Secondary | ICD-10-CM | POA: Diagnosis not present

## 2018-02-19 DIAGNOSIS — M25572 Pain in left ankle and joints of left foot: Secondary | ICD-10-CM | POA: Diagnosis not present

## 2018-02-20 DIAGNOSIS — M9903 Segmental and somatic dysfunction of lumbar region: Secondary | ICD-10-CM | POA: Diagnosis not present

## 2018-02-20 DIAGNOSIS — M25572 Pain in left ankle and joints of left foot: Secondary | ICD-10-CM | POA: Diagnosis not present

## 2018-02-20 DIAGNOSIS — M9906 Segmental and somatic dysfunction of lower extremity: Secondary | ICD-10-CM | POA: Diagnosis not present

## 2018-03-11 ENCOUNTER — Ambulatory Visit (INDEPENDENT_AMBULATORY_CARE_PROVIDER_SITE_OTHER): Payer: 59

## 2018-03-11 ENCOUNTER — Ambulatory Visit (HOSPITAL_COMMUNITY)
Admission: EM | Admit: 2018-03-11 | Discharge: 2018-03-11 | Disposition: A | Discharge status: Home / Self Care | Payer: 59

## 2018-03-11 ENCOUNTER — Encounter (HOSPITAL_COMMUNITY): Payer: Self-pay | Admitting: Emergency Medicine

## 2018-03-11 DIAGNOSIS — Z8781 Personal history of (healed) traumatic fracture: Secondary | ICD-10-CM | POA: Diagnosis not present

## 2018-03-11 DIAGNOSIS — Z3202 Encounter for pregnancy test, result negative: Secondary | ICD-10-CM | POA: Diagnosis not present

## 2018-03-11 DIAGNOSIS — M25572 Pain in left ankle and joints of left foot: Secondary | ICD-10-CM | POA: Diagnosis not present

## 2018-03-11 DIAGNOSIS — S93402A Sprain of unspecified ligament of left ankle, initial encounter: Secondary | ICD-10-CM

## 2018-03-11 DIAGNOSIS — M19072 Primary osteoarthritis, left ankle and foot: Secondary | ICD-10-CM | POA: Diagnosis not present

## 2018-03-11 LAB — POCT PREGNANCY, URINE: Preg Test, Ur: NEGATIVE

## 2018-03-11 IMAGING — DX DG ANKLE COMPLETE 3+V*L*
3 series · 3 of 3 positions shown · non-contrast
Comparison: [DATE] CT left ankle and [DATE] plain film
exam.

CLINICAL DATA: 37-year-old female twisted ankle stepping wrong way
out of car 2 days ago. Pain with walking. History of left foot/ankle
fracture. Initial encounter.

EXAM:
LEFT ANKLE COMPLETE - 3+ VIEW

[ankle ap]
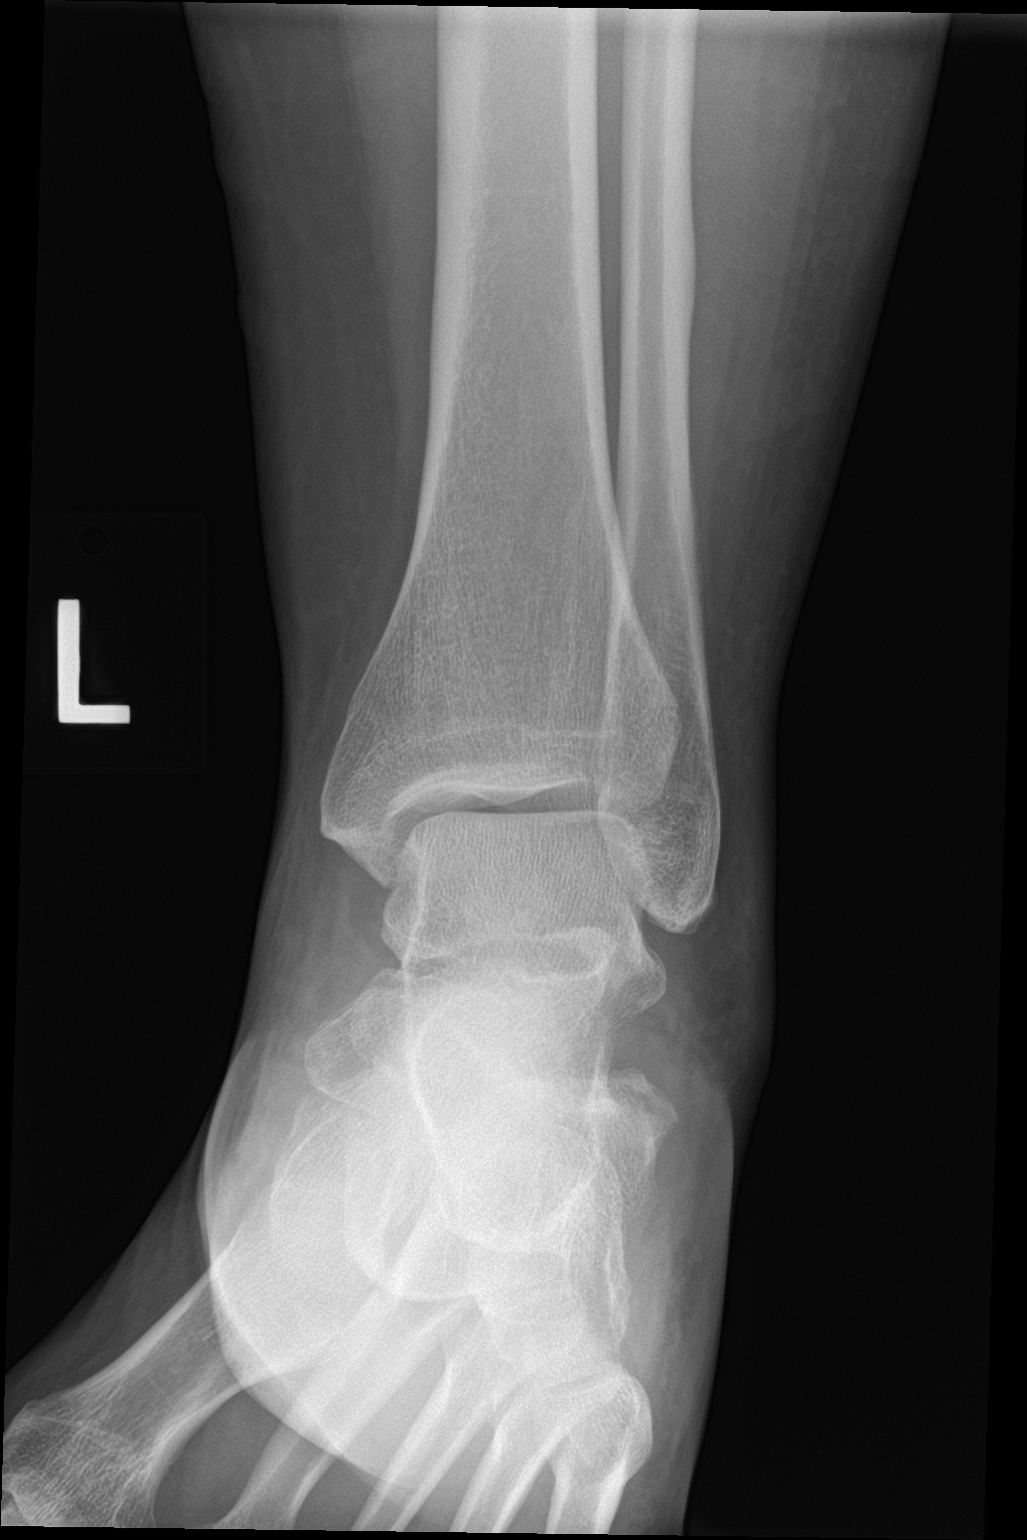

[ankle obl]
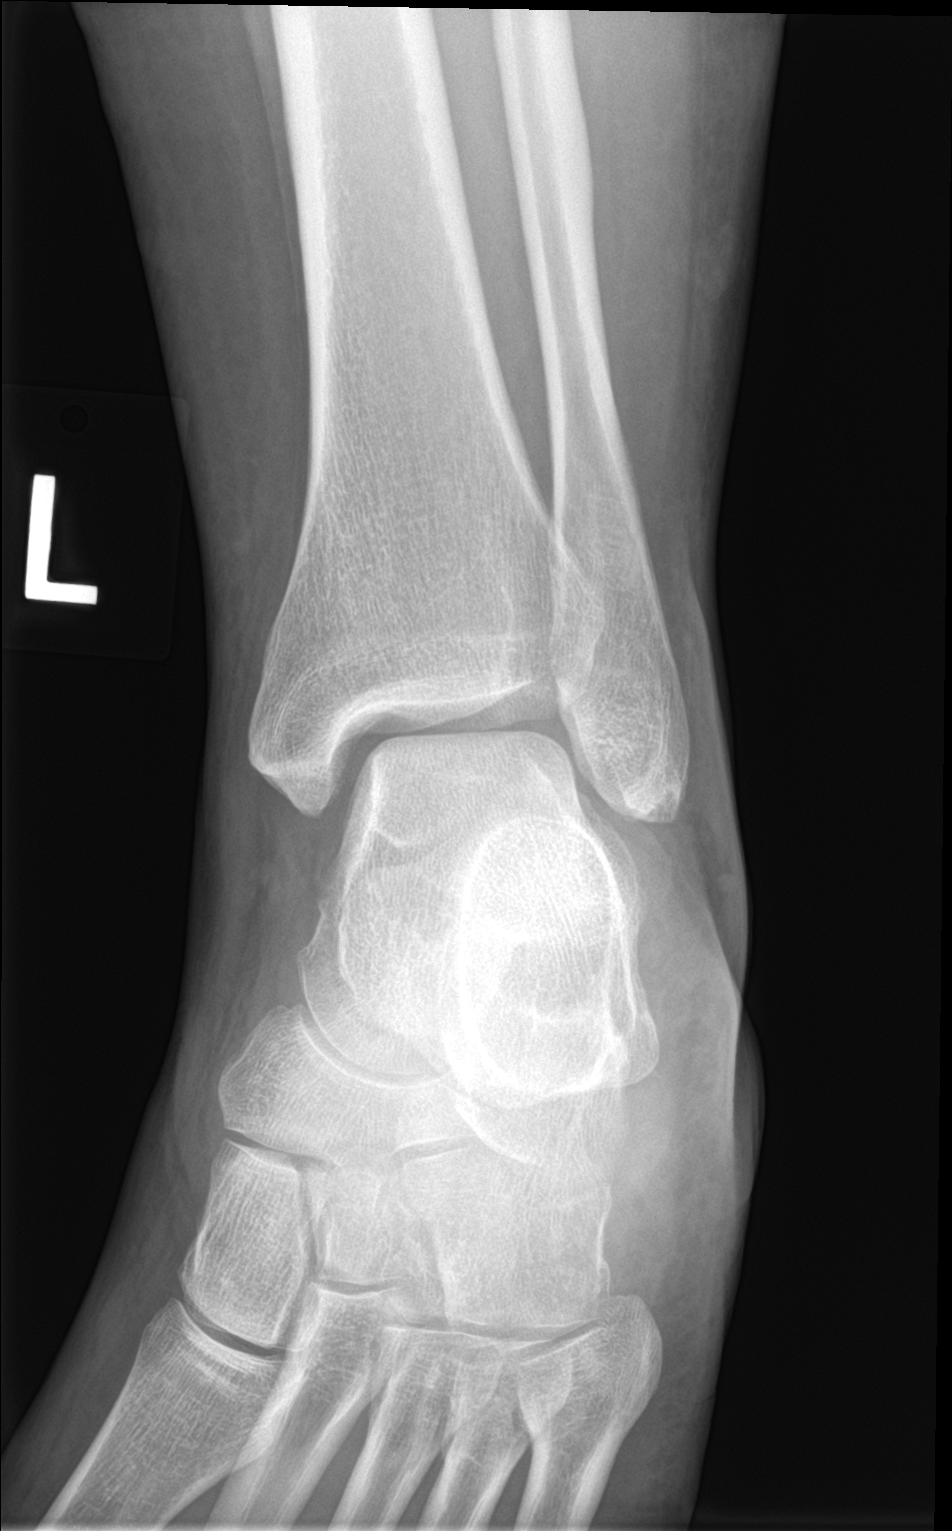

[ankle lat]
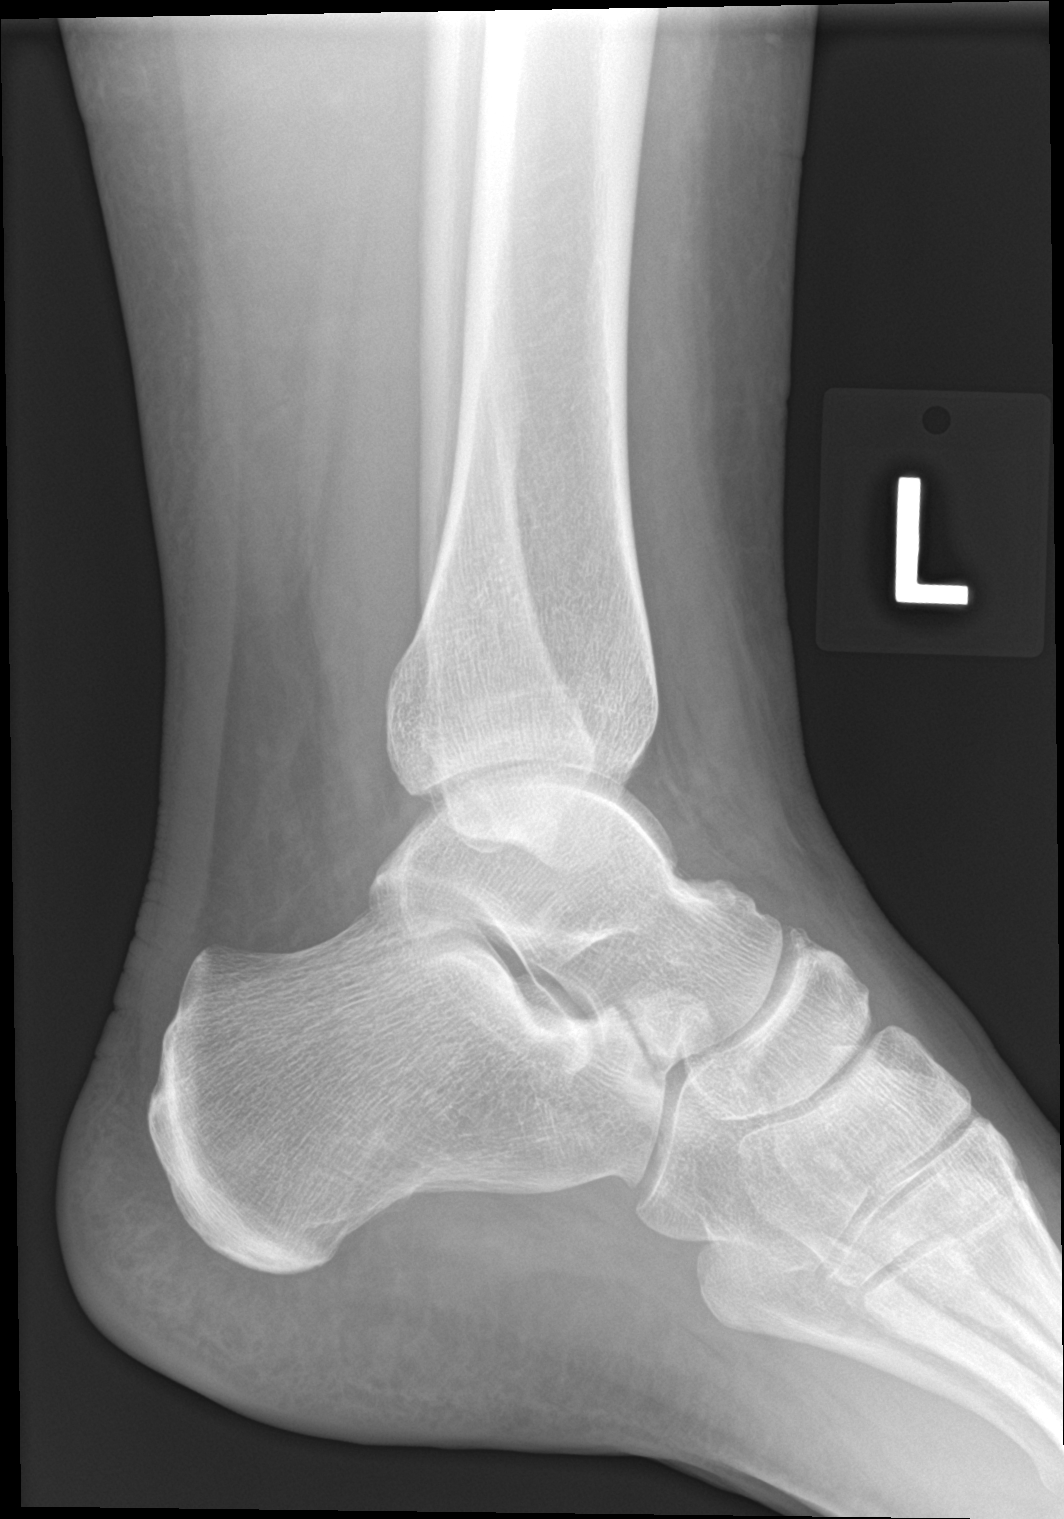

[3 of 3 positions shown; findings below may reference images not displayed]

FINDINGS: No acute fracture or dislocation.

Chronic nonunited fracture anterior process of the calcaneus.

Mild degenerative changes talar navicular articulation.

Mild cortical thickening distal fibula unchanged.

Symmetric soft tissue malleolar region.
IMPRESSION: 1. No acute fracture or dislocation.
2. Chronic nonunited fracture anterior process of the calcaneus.
3. Mild degenerative changes talonavicular articulation.

## 2018-03-11 NOTE — ED Triage Notes (Signed)
PT rolled left ankle two days ago. Broke this ankle three years ago, no surgery was required

## 2018-03-11 NOTE — Discharge Instructions (Signed)
You may take 500mg  Tylenol every 6 hours for pain and inflammation. Use the RICE method for management of your ankle sprain.

## 2018-03-11 NOTE — ED Provider Notes (Signed)
MRN: [id number] DOB: 07-09-1981  Subjective:   Brandi Foster is a 37 y.o. female presenting for 2-day history of left ankle injury.  States that she rolled it laterally and has had left ankle and foot pain since.  She reports some swelling as well.  Has a history of a left ankle fracture that has not healed well.  This occurred about 3 years ago.  She does have an orthopedist that she is following up with.  She has been using some icing and Tylenol with minimal relief.  Wants to make sure that she does not have a recurrent fracture.  She is currently try to become pregnant, does not want any medications.  No current facility-administered medications for this encounter.   Current Outpatient Medications:  .  cetirizine (ZYRTEC) 10 MG tablet, Take 10 mg by mouth daily., Disp: , Rfl:  .  letrozole (FEMARA) 2.5 MG tablet, Take 2.5 mg by mouth daily., Disp: , Rfl:  .  sertraline (ZOLOFT) 25 MG tablet, Take 25 mg by mouth daily., Disp: , Rfl:    Allergies  Allergen Reactions  . Codeine Other (See Comments)    Was told as a child she was allergic  . Sulphadimidine [Sulfamethazine] Other (See Comments)    Was told as a child she was allergic to it.     Past Medical History:  Diagnosis Date  . Allergy   . Anxiety   . Depression      Past Surgical History:  Procedure Laterality Date  . FRACTURE SURGERY    . JOINT REPLACEMENT      Objective:   Vitals: BP 136/80   Pulse 78   Temp 98.4 F (36.9 C) (Oral)   Resp 16   LMP 02/27/2018   SpO2 100%   Physical Exam  Constitutional: She is oriented to person, place, and time. She appears well-developed and well-nourished.  Cardiovascular: Normal rate.  Pulmonary/Chest: Effort normal.  Musculoskeletal:       Left ankle: She exhibits decreased range of motion (slightly including eversion and inversion of foot) and swelling (trace over left lateral malleolus). She exhibits no ecchymosis, no deformity, no laceration and normal pulse.  Tenderness (mild). Lateral malleolus and AITFL tenderness found. No medial malleolus, no CF ligament, no posterior TFL, no head of 5th metatarsal and no proximal fibula tenderness found. Achilles tendon exhibits no pain and no defect.  Neurological: She is alert and oriented to person, place, and time.   Results for orders placed or performed during the hospital encounter of 03/11/18 (from the past 24 hour(s))  Pregnancy, urine POC     Status: None   Collection Time: 03/11/18  9:14 AM  Result Value Ref Range   Preg Test, Ur NEGATIVE NEGATIVE    Dg Ankle Complete Left  Result Date: 03/11/2018 CLINICAL DATA:  37 year old female twisted ankle stepping wrong way out of car 2 days ago. Pain with walking. History of left foot/ankle fracture. Initial encounter. EXAM: LEFT ANKLE COMPLETE - 3+ VIEW COMPARISON:  09/30/2015 CT left ankle and 07/09/2015 plain film exam. FINDINGS: No acute fracture or dislocation. 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 Mild degenerative changes talar navicular articulation. Mild cortical thickening distal fibula unchanged. Symmetric soft tissue malleolar region. IMPRESSION: 1. No acute fracture or dislocation. 2. 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 3. Mild degenerative changes talonavicular articulation. Electronically Signed   By: Lacy Duverney M.D.   On: 03/11/2018 09:33    Assessment and Plan :  Acute left ankle pain  Sprain of left ankle, unspecified ligament, initial encounter  History of fracture of left ankle  We will manage conservatively with RICE method, rest. Use APAP as needed for pain. Follow up with ortho or physical therapist.      Wallis BambergMani, Taylon Coole, Cordelia Poche-C 03/11/18 [id number]

## 2018-03-11 NOTE — ED Notes (Signed)
Pt is drinking water and unable to urinate yet

## 2018-03-15 ENCOUNTER — Ambulatory Visit (HOSPITAL_COMMUNITY)
Admission: EM | Admit: 2018-03-15 | Discharge: 2018-03-15 | Disposition: A | Discharge status: Home / Self Care | Payer: 59 | Attending: Family Medicine | Admitting: Family Medicine

## 2018-03-15 ENCOUNTER — Encounter (HOSPITAL_COMMUNITY): Payer: Self-pay | Admitting: *Deleted

## 2018-03-15 ENCOUNTER — Other Ambulatory Visit: Payer: Self-pay

## 2018-03-15 ENCOUNTER — Ambulatory Visit (HOSPITAL_COMMUNITY): Admission: EM | Admit: 2018-03-15 | Discharge: 2018-03-15 | Discharge status: Absent without leave | Payer: 59

## 2018-03-15 DIAGNOSIS — J069 Acute upper respiratory infection, unspecified: Secondary | ICD-10-CM | POA: Diagnosis not present

## 2018-03-15 DIAGNOSIS — B9789 Other viral agents as the cause of diseases classified elsewhere: Secondary | ICD-10-CM | POA: Diagnosis not present

## 2018-03-15 MED ORDER — PSEUDOEPH-BROMPHEN-DM 30-2-10 MG/5ML PO SYRP: 5.0000 mL | ORAL_SOLUTION | Freq: Four times a day (QID) | ORAL | 0 refills | Status: DC | PRN

## 2018-03-15 MED ORDER — FLUTICASONE PROPIONATE 50 MCG/ACT NA SUSP: 1.0000 | Freq: Every day | NASAL | 0 refills | Status: DC

## 2018-03-15 MED ORDER — AZITHROMYCIN 250 MG PO TABS: 250.0000 mg | ORAL_TABLET | Freq: Every day | ORAL | 0 refills | Status: DC

## 2018-03-15 NOTE — ED Provider Notes (Signed)
[facility]    CSN: [id number] Arrival date & time: 03/15/18  1724     History   Chief Complaint Chief Complaint  Patient presents with  . Appointment    1730  . Cough    HPI Brandi Foster is a 37 y.o. female no contributing past medical history; Patient is presenting with URI symptoms- congestion, cough, sore throat.  Patient has had ear pressure as well, but her sore throat and ear pressure have resolved.  Patient's main complaints are cough and congestion. Symptoms have been going on for 4 days, but notes approximately 3 weeks ago having similar symptoms.. Patient has tried daily allergy pill, NyQuil, severe cold and flu medicine, with minimal relief-but this is only been started in the past 24 hours.  Denies fever, nausea, vomiting, diarrhea. Denies shortness of breath and chest pain.  Cough mainly dry, occasionally productive.   HPI  Past Medical History:  Diagnosis Date  . Allergy   . Anxiety   . Depression     Patient Active Problem List   Diagnosis Date Noted  . Lumbar pain 10/26/2017  . Muscle spasm 10/26/2017  . Musculoskeletal pain 10/26/2017  . Right foot pain 12/18/2016    Past Surgical History:  Procedure Laterality Date  . FRACTURE SURGERY    . JOINT REPLACEMENT      OB History   None      Home Medications    Prior to Admission medications   Medication Sig Start Date End Date Taking? Authorizing Provider  cetirizine (ZYRTEC) 10 MG tablet Take 10 mg by mouth daily.   Yes [provider]  letrozole (FEMARA) 2.5 MG tablet Take 2.5 mg by mouth daily. Takes 5 days out of the month   Yes [provider]  sertraline (ZOLOFT) 25 MG tablet Take 25 mg by mouth daily.   Yes [provider]  azithromycin (ZITHROMAX) 250 MG tablet Take 1 tablet (250 mg total) by mouth daily. Take first 2 tablets together, then 1 every day until finished. 03/15/18   Dealva Lafoy C, PA-C  brompheniramine-pseudoephedrine-DM 30-2-10  MG/5ML syrup Take 5 mLs by mouth 4 (four) times daily as needed. 03/15/18   Gerhart Ruggieri C, PA-C  fluticasone (FLONASE) 50 MCG/ACT nasal spray Place 1-2 sprays into both nostrils daily for 7 days. 03/15/18 03/22/18  Macee Venables, Junius Creamer, PA-C    Family History No family history on file.  Social History Social History   Tobacco Use  . Smoking status: Never Smoker  . Smokeless tobacco: Never Used  Substance Use Topics  . Alcohol use: Yes  . Drug use: No     Allergies   Codeine and Sulphadimidine [sulfamethazine]   Review of Systems Review of Systems  Constitutional: Negative for activity change, appetite change, chills, fatigue and fever.  HENT: Positive for congestion and rhinorrhea. Negative for ear pain, sinus pressure, sore throat and trouble swallowing.   Eyes: Negative for discharge and redness.  Respiratory: Positive for cough and chest tightness. Negative for shortness of breath.   Cardiovascular: Negative for chest pain.  Gastrointestinal: Negative for abdominal pain, diarrhea, nausea and vomiting.  Musculoskeletal: Negative for myalgias.  Skin: Negative for rash.  Neurological: Negative for dizziness, light-headedness and headaches.     Physical Exam Triage Vital Signs ED Triage Vitals  Enc Vitals Group     BP 03/15/18 1749 131/79     Pulse Rate 03/15/18 1749 73     Resp 03/15/18 1749 18  Temp 03/15/18 1749 97.8 F (36.6 C)     Temp Source 03/15/18 1749 Oral     SpO2 03/15/18 1749 100 %     Weight --      Height --      Head Circumference --      Peak Flow --      Pain Score 03/15/18 1750 0     Pain Loc --      Pain Edu? --      Excl. in GC? --    No data found.  Updated Vital Signs BP 131/79   Pulse 73   Temp 97.8 F (36.6 C) (Oral)   Resp 18   LMP 02/27/2018   SpO2 100%   Visual Acuity Right Eye Distance:   Left Eye Distance:   Bilateral Distance:    Right Eye Near:   Left Eye Near:    Bilateral Near:     Physical Exam    Constitutional: She is oriented to person, place, and time. She appears well-developed and well-nourished.  No acute distress  HENT:  Head: Normocephalic and atraumatic.  Nose: Nose normal.  Bilateral ears without tenderness to palpation of external auricle, tragus and mastoid, EAC's without erythema or swelling, TM's with good bony landmarks and cone of light. Non erythematous.  Nasal mucosa erythematous with clear rhinorrhea present  Oral mucosa pink and moist, no tonsillar enlargement or exudate. Posterior pharynx patent and erythematous, no uvula deviation or swelling. Normal phonation.  Eyes: Conjunctivae are normal.  Neck: Neck supple.  Cardiovascular: Normal rate.  Pulmonary/Chest: Effort normal. No respiratory distress.  Breathing comfortably at rest, CTABL, rales or other adventitious sounds auscultated 1 wheeze heard with bronchospasm, but no consistent wheezing  Abdominal: She exhibits no distension.  Musculoskeletal: Normal range of motion.  Neurological: She is alert and oriented to person, place, and time.  Skin: Skin is warm and dry.  Psychiatric: She has a normal mood and affect.  Nursing note and vitals reviewed.    UC Treatments / Results  Labs (all labs ordered are listed, but only abnormal results are displayed) Labs Reviewed - No data to display  EKG None  Radiology No results found.  Procedures Procedures (including critical care time)  Medications Ordered in UC Medications - No data to display  Initial Impression / Assessment and Plan / UC Course  I have reviewed the triage vital signs and the nursing notes.  Pertinent labs & imaging results that were available during my care of the patient were reviewed by me and considered in my medical decision making (see chart for details).     Patient with URI symptoms, likely viral, vital signs stable, exam unremarkable.  Recommend to continue symptomatic management.  Will add in nasal spray with daily  allergy pill, cough syrup provided.  Provided prescription for azithromycin for patient to fill on Monday if symptoms still persisting.Discussed strict return precautions. Patient verbalized understanding and is agreeable with plan.  Final Clinical Impressions(s) / UC Diagnoses   Final diagnoses:  Viral URI with cough     Discharge Instructions     Your symptoms are most likely viral and should resolve gradually over the next week For congestion please continue daily allergy pill, add in Flonase or Nasacort nasal spray. For cough please use cough syrup provided, this also has an oral decongestant.  Please take Tylenol and ibuprofen for any chest discomfort that has developed from coughing  You may fill prescription for azithromycin on Monday if still  not having any improvement over the weekend with consistent symptomatic management.  Please return if developing fever, worsening chest discomfort, symptoms not improving, difficulty breathing or shortness of breath.   ED Prescriptions    Medication Sig Dispense Auth. Provider   fluticasone (FLONASE) 50 MCG/ACT nasal spray Place 1-2 sprays into both nostrils daily for 7 days. 1 g Katheline Brendlinger C, PA-C   brompheniramine-pseudoephedrine-DM 30-2-10 MG/5ML syrup Take 5 mLs by mouth 4 (four) times daily as needed. 120 mL Sherion Dooly C, PA-C   azithromycin (ZITHROMAX) 250 MG tablet Take 1 tablet (250 mg total) by mouth daily. Take first 2 tablets together, then 1 every day until finished. 6 tablet Latoshia Monrroy, [place] C, PA-C     Controlled Substance Prescriptions [place] Controlled Substance Registry consulted? Not Applicable   Lew DawesWieters, Bobbe Quilter C, [place]PA-C 03/15/18 1832

## 2018-03-15 NOTE — ED Triage Notes (Signed)
Reports cough onset 3 days ago with worsening.  C/O productive cough with greenish sputum and severe coughing fits.

## 2018-03-15 NOTE — Discharge Instructions (Signed)
Your symptoms are most likely viral and should resolve gradually over the next week For congestion please continue daily allergy pill, add in Flonase or Nasacort nasal spray. For cough please use cough syrup provided, this also has an oral decongestant.  Please take Tylenol and ibuprofen for any chest discomfort that has developed from coughing  You may fill prescription for azithromycin on Monday if still not having any improvement over the weekend with consistent symptomatic management.  Please return if developing fever, worsening chest discomfort, symptoms not improving, difficulty breathing or shortness of breath.

## 2018-04-12 DIAGNOSIS — Z23 Encounter for immunization: Secondary | ICD-10-CM | POA: Diagnosis not present

## 2018-05-07 DIAGNOSIS — N925 Other specified irregular menstruation: Secondary | ICD-10-CM | POA: Diagnosis not present

## 2018-05-17 DIAGNOSIS — O3680X9 Pregnancy with inconclusive fetal viability, other fetus: Secondary | ICD-10-CM | POA: Diagnosis not present

## 2018-05-17 DIAGNOSIS — Z3480 Encounter for supervision of other normal pregnancy, unspecified trimester: Secondary | ICD-10-CM | POA: Diagnosis not present

## 2018-06-17 DIAGNOSIS — Z3480 Encounter for supervision of other normal pregnancy, unspecified trimester: Secondary | ICD-10-CM | POA: Diagnosis not present

## 2018-07-16 DIAGNOSIS — Z3482 Encounter for supervision of other normal pregnancy, second trimester: Secondary | ICD-10-CM | POA: Diagnosis not present

## 2018-08-07 NOTE — L&D Delivery Note (Signed)
Patient was C/C/+2 and pushed for 3 hours with epidural.    NSVD  female infant, Apgars 7,9, weight P.   The patient had midline perineal second degree laceration repaired with 2-0 vicryl R. Fundus was firm. EBL was expected amount. Placenta was delivered intact. Vagina was clear.  Delayed cord clamping done for 30-60 seconds while warming baby. Baby was vigorous and doing skin to skin with mother.  Loney Laurence

## 2018-08-14 DIAGNOSIS — O09899 Supervision of other high risk pregnancies, unspecified trimester: Secondary | ICD-10-CM | POA: Diagnosis not present

## 2018-09-26 DIAGNOSIS — F411 Generalized anxiety disorder: Secondary | ICD-10-CM | POA: Diagnosis not present

## 2018-10-10 DIAGNOSIS — O4442 Low lying placenta NOS or without hemorrhage, second trimester: Secondary | ICD-10-CM | POA: Diagnosis not present

## 2018-10-10 DIAGNOSIS — Z3480 Encounter for supervision of other normal pregnancy, unspecified trimester: Secondary | ICD-10-CM | POA: Diagnosis not present

## 2018-10-10 DIAGNOSIS — Z23 Encounter for immunization: Secondary | ICD-10-CM | POA: Diagnosis not present

## 2018-10-21 DIAGNOSIS — O99810 Abnormal glucose complicating pregnancy: Secondary | ICD-10-CM | POA: Diagnosis not present

## 2018-10-24 DIAGNOSIS — F411 Generalized anxiety disorder: Secondary | ICD-10-CM | POA: Diagnosis not present

## 2018-11-05 DIAGNOSIS — F411 Generalized anxiety disorder: Secondary | ICD-10-CM | POA: Diagnosis not present

## 2018-11-12 DIAGNOSIS — F411 Generalized anxiety disorder: Secondary | ICD-10-CM | POA: Diagnosis not present

## 2018-11-14 DIAGNOSIS — Z369 Encounter for antenatal screening, unspecified: Secondary | ICD-10-CM | POA: Diagnosis not present

## 2018-11-27 DIAGNOSIS — F411 Generalized anxiety disorder: Secondary | ICD-10-CM | POA: Diagnosis not present

## 2018-11-28 DIAGNOSIS — Z369 Encounter for antenatal screening, unspecified: Secondary | ICD-10-CM | POA: Diagnosis not present

## 2018-12-09 DIAGNOSIS — O26849 Uterine size-date discrepancy, unspecified trimester: Secondary | ICD-10-CM | POA: Diagnosis not present

## 2018-12-09 DIAGNOSIS — Z369 Encounter for antenatal screening, unspecified: Secondary | ICD-10-CM | POA: Diagnosis not present

## 2018-12-09 DIAGNOSIS — Z3480 Encounter for supervision of other normal pregnancy, unspecified trimester: Secondary | ICD-10-CM | POA: Diagnosis not present

## 2018-12-10 DIAGNOSIS — F411 Generalized anxiety disorder: Secondary | ICD-10-CM | POA: Diagnosis not present

## 2018-12-19 DIAGNOSIS — O09523 Supervision of elderly multigravida, third trimester: Secondary | ICD-10-CM | POA: Diagnosis not present

## 2018-12-20 ENCOUNTER — Inpatient Hospital Stay (HOSPITAL_COMMUNITY): Payer: BLUE CROSS/BLUE SHIELD | Admitting: Anesthesiology

## 2018-12-20 ENCOUNTER — Encounter (HOSPITAL_COMMUNITY): Payer: Self-pay | Admitting: *Deleted

## 2018-12-20 ENCOUNTER — Inpatient Hospital Stay (HOSPITAL_COMMUNITY)
Admission: AD | Admit: 2018-12-20 | Discharge: 2018-12-24 | DRG: 807 | Disposition: A | Discharge status: Home / Self Care | Payer: BLUE CROSS/BLUE SHIELD | Attending: Obstetrics and Gynecology | Admitting: Obstetrics and Gynecology

## 2018-12-20 ENCOUNTER — Other Ambulatory Visit: Payer: Self-pay

## 2018-12-20 DIAGNOSIS — O134 Gestational [pregnancy-induced] hypertension without significant proteinuria, complicating childbirth: Secondary | ICD-10-CM | POA: Diagnosis not present

## 2018-12-20 DIAGNOSIS — Z1159 Encounter for screening for other viral diseases: Secondary | ICD-10-CM | POA: Diagnosis not present

## 2018-12-20 DIAGNOSIS — Z3A37 37 weeks gestation of pregnancy: Secondary | ICD-10-CM

## 2018-12-20 DIAGNOSIS — Z23 Encounter for immunization: Secondary | ICD-10-CM | POA: Diagnosis not present

## 2018-12-20 DIAGNOSIS — Z3490 Encounter for supervision of normal pregnancy, unspecified, unspecified trimester: Secondary | ICD-10-CM

## 2018-12-20 LAB — CBC
HCT: 35.3 % — ABNORMAL LOW (ref 36.0–46.0)
Hemoglobin: 12 g/dL (ref 12.0–15.0)
MCH: 31 pg (ref 26.0–34.0)
MCHC: 34 g/dL (ref 30.0–36.0)
MCV: 91.2 fL (ref 80.0–100.0)
Platelets: 173 10*3/uL (ref 150–400)
RBC: 3.87 MIL/uL (ref 3.87–5.11)
RDW: 13.7 % (ref 11.5–15.5)
WBC: 10.3 10*3/uL (ref 4.0–10.5)
nRBC: 0 % (ref 0.0–0.2)

## 2018-12-20 LAB — TYPE AND SCREEN
ABO/RH(D): B POS
Antibody Screen: NEGATIVE

## 2018-12-20 LAB — COMPREHENSIVE METABOLIC PANEL
ALT: 16 U/L (ref 0–44)
AST: 20 U/L (ref 15–41)
Albumin: 2.9 g/dL — ABNORMAL LOW (ref 3.5–5.0)
Alkaline Phosphatase: 88 U/L (ref 38–126)
Anion gap: 8 (ref 5–15)
BUN: 15 mg/dL (ref 6–20)
CO2: 22 mmol/L (ref 22–32)
Calcium: 8.9 mg/dL (ref 8.9–10.3)
Chloride: 108 mmol/L (ref 98–111)
Creatinine, Ser: 0.84 mg/dL (ref 0.44–1.00)
GFR calc Af Amer: >60 mL/min (ref >60)
GFR calc non Af Amer: >60 mL/min (ref >60)
Glucose, Bld: 81 mg/dL (ref 70–99)
Potassium: 4 mmol/L (ref 3.5–5.1)
Sodium: 138 mmol/L (ref 135–145)
Total Bilirubin: 0.7 mg/dL (ref 0.3–1.2)
Total Protein: 6 g/dL — ABNORMAL LOW (ref 6.5–8.1)

## 2018-12-20 LAB — PROTEIN / CREATININE RATIO, URINE
Creatinine, Urine: 240.07 mg/dL
Protein Creatinine Ratio: 1.46 mg/mg{Cre} — ABNORMAL HIGH (ref 0.00–0.15)
Total Protein, Urine: 350 mg/dL

## 2018-12-20 LAB — SARS CORONAVIRUS 2 BY RT PCR (HOSPITAL ORDER, PERFORMED IN ~~LOC~~ HOSPITAL LAB): SARS Coronavirus 2: NEGATIVE

## 2018-12-20 LAB — ABO/RH: ABO/RH(D): B POS

## 2018-12-20 MED ORDER — SOD CITRATE-CITRIC ACID 500-334 MG/5ML PO SOLN: 30.0000 mL | ORAL | Status: DC | PRN

## 2018-12-20 MED ORDER — DIPHENHYDRAMINE HCL 50 MG/ML IJ SOLN: 12.5000 mg | INTRAMUSCULAR | Status: DC | PRN

## 2018-12-20 MED ORDER — TERBUTALINE SULFATE 1 MG/ML IJ SOLN: 0.2500 mg | Freq: Once | INTRAMUSCULAR | Status: DC | PRN

## 2018-12-20 MED ORDER — EPHEDRINE 5 MG/ML INJ
10.0000 mg | INTRAVENOUS | Status: DC | PRN
  Filled 2018-12-20: qty 2

## 2018-12-20 MED ORDER — LACTATED RINGERS IV SOLN: 500.0000 mL | Freq: Once | INTRAVENOUS | Status: DC

## 2018-12-20 MED ORDER — FENTANYL CITRATE (PF) 100 MCG/2ML IJ SOLN
INTRAMUSCULAR | Status: DC | PRN
  Administered 2018-12-20 (×2): 50 ug via EPIDURAL

## 2018-12-20 MED ORDER — OXYTOCIN BOLUS FROM INFUSION
500.0000 mL | Freq: Once | INTRAVENOUS | Status: AC
  Administered 2018-12-21: 500 mL via INTRAVENOUS

## 2018-12-20 MED ORDER — LABETALOL HCL 5 MG/ML IV SOLN
20.0000 mg | INTRAVENOUS | Status: DC | PRN
  Administered 2018-12-20 – 2018-12-21 (×2): 20 mg via INTRAVENOUS
  Filled 2018-12-20 (×2): qty 4

## 2018-12-20 MED ORDER — MAGNESIUM SULFATE 40 G IN LACTATED RINGERS - SIMPLE
2.0000 g/h | INTRAVENOUS | Status: DC
  Administered 2018-12-20 – 2018-12-21 (×2): 2 g/h via INTRAVENOUS
  Filled 2018-12-20 (×3): qty 500

## 2018-12-20 MED ORDER — OXYTOCIN 40 UNITS IN NORMAL SALINE INFUSION - SIMPLE MED
2.5000 [IU]/h | INTRAVENOUS | Status: DC
  Administered 2018-12-21: 2.5 [IU]/h via INTRAVENOUS

## 2018-12-20 MED ORDER — LACTATED RINGERS IV SOLN: 500.0000 mL | INTRAVENOUS | Status: DC | PRN

## 2018-12-20 MED ORDER — FLEET ENEMA 7-19 GM/118ML RE ENEM: 1.0000 | ENEMA | Freq: Every day | RECTAL | Status: DC | PRN

## 2018-12-20 MED ORDER — ACETAMINOPHEN 325 MG PO TABS: 650.0000 mg | ORAL_TABLET | ORAL | Status: DC | PRN

## 2018-12-20 MED ORDER — PHENYLEPHRINE 40 MCG/ML (10ML) SYRINGE FOR IV PUSH (FOR BLOOD PRESSURE SUPPORT)
80.0000 ug | PREFILLED_SYRINGE | INTRAVENOUS | Status: DC | PRN
  Filled 2018-12-20: qty 10

## 2018-12-20 MED ORDER — ONDANSETRON HCL 4 MG/2ML IJ SOLN
4.0000 mg | Freq: Four times a day (QID) | INTRAMUSCULAR | Status: DC | PRN
  Administered 2018-12-20: 4 mg via INTRAVENOUS
  Filled 2018-12-20: qty 2

## 2018-12-20 MED ORDER — LACTATED RINGERS IV SOLN
INTRAVENOUS | Status: DC
  Administered 2018-12-20 (×2): via INTRAVENOUS

## 2018-12-20 MED ORDER — SODIUM CHLORIDE (PF) 0.9 % IJ SOLN
INTRAMUSCULAR | Status: DC | PRN
  Administered 2018-12-20: 12 mL/h via EPIDURAL

## 2018-12-20 MED ORDER — OXYTOCIN 40 UNITS IN NORMAL SALINE INFUSION - SIMPLE MED
1.0000 m[IU]/min | INTRAVENOUS | Status: DC
  Administered 2018-12-20: 8 m[IU]/min via INTRAVENOUS
  Administered 2018-12-20: 2 m[IU]/min via INTRAVENOUS
  Filled 2018-12-20: qty 1000

## 2018-12-20 MED ORDER — LABETALOL HCL 5 MG/ML IV SOLN
40.0000 mg | INTRAVENOUS | Status: DC | PRN
  Administered 2018-12-20: 40 mg via INTRAVENOUS
  Filled 2018-12-20: qty 8

## 2018-12-20 MED ORDER — HYDRALAZINE HCL 20 MG/ML IJ SOLN: 10.0000 mg | INTRAMUSCULAR | Status: DC | PRN

## 2018-12-20 MED ORDER — LIDOCAINE HCL (PF) 1 % IJ SOLN
30.0000 mL | INTRAMUSCULAR | Status: AC | PRN
  Administered 2018-12-21: 30 mL via SUBCUTANEOUS
  Filled 2018-12-20: qty 30

## 2018-12-20 MED ORDER — FENTANYL-BUPIVACAINE-NACL 0.5-0.125-0.9 MG/250ML-% EP SOLN: 12.0000 mL/h | EPIDURAL | Status: DC | PRN

## 2018-12-20 MED ORDER — FENTANYL-BUPIVACAINE-NACL 0.5-0.125-0.9 MG/250ML-% EP SOLN
12.0000 mL/h | EPIDURAL | Status: DC | PRN
  Filled 2018-12-20: qty 250

## 2018-12-20 MED ORDER — MAGNESIUM SULFATE BOLUS VIA INFUSION
4.0000 g | Freq: Once | INTRAVENOUS | Status: AC
  Administered 2018-12-20: 4 g via INTRAVENOUS
  Filled 2018-12-20: qty 500

## 2018-12-20 MED ORDER — LIDOCAINE HCL (PF) 1 % IJ SOLN
INTRAMUSCULAR | Status: DC | PRN
  Administered 2018-12-20: 6 mL via EPIDURAL

## 2018-12-20 MED ORDER — FENTANYL CITRATE (PF) 100 MCG/2ML IJ SOLN
INTRAMUSCULAR | Status: AC
  Filled 2018-12-20: qty 2

## 2018-12-20 MED ORDER — BUPIVACAINE HCL (PF) 0.25 % IJ SOLN
INTRAMUSCULAR | Status: DC | PRN
  Administered 2018-12-20 (×2): 5 mL via EPIDURAL

## 2018-12-20 MED ORDER — LABETALOL HCL 5 MG/ML IV SOLN: 80.0000 mg | INTRAVENOUS | Status: DC | PRN

## 2018-12-20 MED ORDER — PHENYLEPHRINE 40 MCG/ML (10ML) SYRINGE FOR IV PUSH (FOR BLOOD PRESSURE SUPPORT)
80.0000 ug | PREFILLED_SYRINGE | INTRAVENOUS | Status: DC | PRN
  Filled 2018-12-20 (×2): qty 10

## 2018-12-20 NOTE — H&P (Signed)
38 y.o. [redacted]w[redacted]d  G1P0 comes in for induction at term for [facility].  Yesterday pt had second BP with dialstolic over 90; last one was 2 weeks ago.  She denies preeclampsia severe symptoms.  Otherwise has good fetal movement and no bleeding.  Past Medical History:  Diagnosis Date  . Allergy   . Anxiety   . Depression     Past Surgical History:  Procedure Laterality Date  . FRACTURE SURGERY    . JOINT REPLACEMENT      OB History  Gravida Para Term Preterm AB Living  1            SAB TAB Ectopic Multiple Live Births               # Outcome Date GA Lbr Len/2nd Weight Sex Delivery Anes PTL Lv  1 Current             Social History   Socioeconomic History  . Marital status: Single    Spouse name: Not on file  . Number of children: Not on file  . Years of education: Not on file  . Highest education level: Not on file  Occupational History  . Not on file  Social Needs  . Financial resource strain: Not on file  . Food insecurity:    Worry: Not on file    Inability: Not on file  . Transportation needs:    Medical: Not on file    Non-medical: Not on file  Tobacco Use  . Smoking status: Never Smoker  . Smokeless tobacco: Never Used  Substance and Sexual Activity  . Alcohol use: Yes    Comment: occasional; none while pregnant  . Drug use: No  . Sexual activity: Yes    Birth control/protection: None  Lifestyle  . Physical activity:    Days per week: Not on file    Minutes per session: Not on file  . Stress: Not on file  Relationships  . Social connections:    Talks on phone: Not on file    Gets together: Not on file    Attends religious service: Not on file    Active member of club or organization: Not on file    Attends meetings of clubs or organizations: Not on file    Relationship status: Not on file  . Intimate partner violence:    Fear of current or ex partner: Not on file    Emotionally abused: Not on file    Physically abused: Not on file    Forced sexual activity:  Not on file  Other Topics Concern  . Not on file  Social History Narrative  . Not on file   Codeine; Other; and Sulphadimidine [sulfamethazine]    Prenatal Transfer Tool  Maternal Diabetes: No Genetic Screening: Normal Maternal Ultrasounds/Referrals: Normal Fetal Ultrasounds or other Referrals:  None Maternal Substance Abuse:  No Significant Maternal Medications:  None except sertralline Significant Maternal Lab Results: None  Other PNC: uncomplicated.    There were no vitals filed for this visit.  Lungs/Cor:  NAD Abdomen:  soft, gravid Ex:  no cords, erythema SVE:  3/50/-2 in office yesterday FHTs:  130s, good STV, NST R; Cat 1 tracing. Toco:  q occ   A/P   AMA with new onset GHTN.  For induction.  GBS neg.    Loney Laurence

## 2018-12-20 NOTE — Progress Notes (Signed)
Pt comfortable with epidural.  Vitals:   12/20/18 2010 12/20/18 2011 12/20/18 2015 12/20/18 2016  BP:  123/81  122/74  Pulse:  78  77  Resp:      Temp:      TempSrc:      SpO2: 99%  97%   Weight:      Height:       FHTs CTSP for decel to 70s for 8 minutes and resolved in 10 minutes.  Previously FHTS were reactive with earlies that then changed to mild variables.   Currently  140s, gSTV fair LTV and some late decels for cat 2 tracing.  Toco q4  SVE C/C/+2  Results for orders placed or performed during the hospital encounter of 12/20/18 (from the past 24 hour(s))  CBC     Status: Abnormal   Collection Time: 12/20/18 10:43 AM  Result Value Ref Range   WBC 10.3 4.0 - 10.5 K/uL   RBC 3.87 3.87 - 5.11 MIL/uL   Hemoglobin 12.0 12.0 - 15.0 g/dL   HCT 35.3 (L) 36.0 - 46.0 %   MCV 91.2 80.0 - 100.0 fL   MCH 31.0 26.0 - 34.0 pg   MCHC 34.0 30.0 - 36.0 g/dL   RDW 13.7 11.5 - 15.5 %   Platelets 173 150 - 400 K/uL   nRBC 0.0 0.0 - 0.2 %  Comprehensive metabolic panel     Status: Abnormal   Collection Time: 12/20/18 10:43 AM  Result Value Ref Range   Sodium 138 135 - 145 mmol/L   Potassium 4.0 3.5 - 5.1 mmol/L   Chloride 108 98 - 111 mmol/L   CO2 22 22 - 32 mmol/L   Glucose, Bld 81 70 - 99 mg/dL   BUN 15 6 - 20 mg/dL   Creatinine, Ser 0.84 0.44 - 1.00 mg/dL   Calcium 8.9 8.9 - 10.3 mg/dL   Total Protein 6.0 (L) 6.5 - 8.1 g/dL   Albumin 2.9 (L) 3.5 - 5.0 g/dL   AST 20 15 - 41 U/L   ALT 16 0 - 44 U/L   Alkaline Phosphatase 88 38 - 126 U/L   Total Bilirubin 0.7 0.3 - 1.2 mg/dL   GFR calc non Af Amer >60 >60 mL/min   GFR calc Af Amer >60 >60 mL/min   Anion gap 8 5 - 15  Type and screen [place]     Status: None   Collection Time: 12/20/18 11:25 AM  Result Value Ref Range   ABO/RH(D) B POS    Antibody Screen NEG    Sample Expiration      12/23/2018,2359 Performed at [facility], [address] [place], [place] [address]   ABO/Rh     Status: None    Collection Time: 12/20/18 11:25 AM  Result Value Ref Range   ABO/RH(D)      B POS Performed at [place] [address] [place], [place] [address]   SARS Coronavirus 2 (CEPHEID - Performed in [facility] hospital lab), Hosp Order     Status: None   Collection Time: 12/20/18 12:03 PM  Result Value Ref Range   SARS Coronavirus 2 NEGATIVE NEGATIVE    A/P G1 C/C/+2 with cat 2 tracing after decel.  FHTs were reassuring before decel- expect the tracing to return to normal after recovery.  Decel resolved with O2, position change and scalp stimulation with FSE. Will have pt begin pushing with expectation of vaginal delivery.  Pt met  severe criteria earlier by BPs and was started on HTN protocol and Magnesium sulfate.  Labs were normal.

## 2018-12-20 NOTE — Progress Notes (Signed)
10 Instruments 5 9*9 5 4*18 2 Injectables  

## 2018-12-20 NOTE — Anesthesia Procedure Notes (Signed)
Epidural Patient location during procedure: OB Start time: 12/20/2018 4:11 PM End time: 12/20/2018 4:15 PM  Staffing Anesthesiologist: Bethena Midget, MD  Preanesthetic Checklist Completed: patient identified, site marked, surgical consent, pre-op evaluation, timeout performed, IV checked, risks and benefits discussed and monitors and equipment checked  Epidural Patient position: sitting Prep: site prepped and draped and DuraPrep Patient monitoring: continuous pulse ox and blood pressure Approach: midline Location: L3-L4 Injection technique: LOR air  Needle:  Needle type: Tuohy  Needle gauge: 17 G Needle length: 9 cm and 9 Needle insertion depth: 9 cm Catheter type: closed end flexible Catheter size: 19 Gauge Catheter at skin depth: 14 cm Test dose: negative  Assessment Events: blood not aspirated, injection not painful, no injection resistance, negative IV test and no paresthesia

## 2018-12-21 ENCOUNTER — Encounter (HOSPITAL_COMMUNITY): Payer: Self-pay

## 2018-12-21 LAB — RPR: RPR Ser Ql: NONREACTIVE

## 2018-12-21 LAB — CBC
HCT: 29.8 % — ABNORMAL LOW (ref 36.0–46.0)
Hemoglobin: 9.9 g/dL — ABNORMAL LOW (ref 12.0–15.0)
MCH: 31.4 pg (ref 26.0–34.0)
MCHC: 33.2 g/dL (ref 30.0–36.0)
MCV: 94.6 fL (ref 80.0–100.0)
Platelets: 154 10*3/uL (ref 150–400)
RBC: 3.15 MIL/uL — ABNORMAL LOW (ref 3.87–5.11)
RDW: 14 % (ref 11.5–15.5)
WBC: 19.4 10*3/uL — ABNORMAL HIGH (ref 4.0–10.5)
nRBC: 0 % (ref 0.0–0.2)

## 2018-12-21 MED ORDER — ACETAMINOPHEN 325 MG PO TABS: 650.0000 mg | ORAL_TABLET | ORAL | Status: DC | PRN

## 2018-12-21 MED ORDER — SODIUM CHLORIDE 0.9% FLUSH
3.0000 mL | Freq: Two times a day (BID) | INTRAVENOUS | Status: DC
  Administered 2018-12-22 – 2018-12-24 (×3): 3 mL via INTRAVENOUS

## 2018-12-21 MED ORDER — ZOLPIDEM TARTRATE 5 MG PO TABS: 5.0000 mg | ORAL_TABLET | Freq: Every evening | ORAL | Status: DC | PRN

## 2018-12-21 MED ORDER — SENNOSIDES-DOCUSATE SODIUM 8.6-50 MG PO TABS
2.0000 | ORAL_TABLET | ORAL | Status: DC
  Administered 2018-12-21 – 2018-12-23 (×3): 2 via ORAL
  Filled 2018-12-21 (×3): qty 2

## 2018-12-21 MED ORDER — SODIUM CHLORIDE 0.9 % IV SOLN: 250.0000 mL | INTRAVENOUS | Status: DC | PRN

## 2018-12-21 MED ORDER — ONDANSETRON HCL 4 MG PO TABS: 4.0000 mg | ORAL_TABLET | ORAL | Status: DC | PRN

## 2018-12-21 MED ORDER — OXYCODONE-ACETAMINOPHEN 5-325 MG PO TABS: 2.0000 | ORAL_TABLET | ORAL | Status: DC | PRN

## 2018-12-21 MED ORDER — SODIUM CHLORIDE 0.9% FLUSH: 3.0000 mL | INTRAVENOUS | Status: DC | PRN

## 2018-12-21 MED ORDER — WITCH HAZEL-GLYCERIN EX PADS: 1.0000 "application " | MEDICATED_PAD | CUTANEOUS | Status: DC | PRN

## 2018-12-21 MED ORDER — FERROUS SULFATE 325 (65 FE) MG PO TABS
325.0000 mg | ORAL_TABLET | Freq: Two times a day (BID) | ORAL | Status: DC
  Administered 2018-12-21 – 2018-12-24 (×6): 325 mg via ORAL
  Filled 2018-12-21 (×7): qty 1

## 2018-12-21 MED ORDER — METHYLERGONOVINE MALEATE 0.2 MG PO TABS: 0.2000 mg | ORAL_TABLET | ORAL | Status: DC | PRN

## 2018-12-21 MED ORDER — DIBUCAINE (PERIANAL) 1 % EX OINT: 1.0000 "application " | TOPICAL_OINTMENT | CUTANEOUS | Status: DC | PRN

## 2018-12-21 MED ORDER — TETANUS-DIPHTH-ACELL PERTUSSIS 5-2.5-18.5 LF-MCG/0.5 IM SUSP: 0.5000 mL | Freq: Once | INTRAMUSCULAR | Status: DC

## 2018-12-21 MED ORDER — MEASLES, MUMPS & RUBELLA VAC IJ SOLR: 0.5000 mL | Freq: Once | INTRAMUSCULAR | Status: DC

## 2018-12-21 MED ORDER — FENTANYL CITRATE (PF) 100 MCG/2ML IJ SOLN: 100.0000 ug | Freq: Once | INTRAMUSCULAR | Status: DC

## 2018-12-21 MED ORDER — BENZOCAINE-MENTHOL 20-0.5 % EX AERO
1.0000 "application " | INHALATION_SPRAY | CUTANEOUS | Status: DC | PRN
  Administered 2018-12-23: 1 via TOPICAL
  Filled 2018-12-21: qty 56

## 2018-12-21 MED ORDER — COCONUT OIL OIL
1.0000 "application " | TOPICAL_OIL | Status: DC | PRN
  Administered 2018-12-23: 1 via TOPICAL

## 2018-12-21 MED ORDER — METHYLERGONOVINE MALEATE 0.2 MG/ML IJ SOLN: 0.2000 mg | INTRAMUSCULAR | Status: DC | PRN

## 2018-12-21 MED ORDER — SIMETHICONE 80 MG PO CHEW: 80.0000 mg | CHEWABLE_TABLET | ORAL | Status: DC | PRN

## 2018-12-21 MED ORDER — ONDANSETRON HCL 4 MG/2ML IJ SOLN: 4.0000 mg | INTRAMUSCULAR | Status: DC | PRN

## 2018-12-21 MED ORDER — LACTATED RINGERS IV SOLN
INTRAVENOUS | Status: DC
  Administered 2018-12-21 (×2): via INTRAVENOUS

## 2018-12-21 MED ORDER — SERTRALINE HCL 50 MG PO TABS
25.0000 mg | ORAL_TABLET | Freq: Every day | ORAL | Status: DC
  Administered 2018-12-21 – 2018-12-23 (×3): 25 mg via ORAL
  Filled 2018-12-21 (×3): qty 1

## 2018-12-21 MED ORDER — IBUPROFEN 800 MG PO TABS
800.0000 mg | ORAL_TABLET | Freq: Three times a day (TID) | ORAL | Status: DC
  Administered 2018-12-21 – 2018-12-24 (×9): 800 mg via ORAL
  Filled 2018-12-21 (×9): qty 1

## 2018-12-21 MED ORDER — MAGNESIUM HYDROXIDE 400 MG/5ML PO SUSP: 30.0000 mL | ORAL | Status: DC | PRN

## 2018-12-21 MED ORDER — DIPHENHYDRAMINE HCL 25 MG PO CAPS: 25.0000 mg | ORAL_CAPSULE | Freq: Four times a day (QID) | ORAL | Status: DC | PRN

## 2018-12-21 MED ORDER — PRENATAL MULTIVITAMIN CH
1.0000 | ORAL_TABLET | Freq: Every day | ORAL | Status: DC
  Administered 2018-12-23 – 2018-12-24 (×2): 1 via ORAL
  Filled 2018-12-21 (×2): qty 1

## 2018-12-21 NOTE — Lactation Note (Signed)
This note was copied from a baby's chart. Lactation Consultation Note  Patient Name: Brandi Foster [id number] Date: 12/21/2018 Reason for consult: Initial assessment;Primapara;1st time breastfeeding;Early term 37-38.6wks GHTN MgSO4  Visited with P1 Mom of ET infant at 82 hrs old.  Baby swaddled in crib, and Mom resting and trying to take a nap.  Baby showing subtle cues.  Offered to assist with positioning and latching.   Reviewed breast massage and hand expression.  Small drop noted on right breast.  Mom in laid back position, placed baby STS on Mom's chest, baby opened her mouth widely to latch, but wouldn't suck.  Baby turned her head and fell asleep.  Reassured Mom and FOB that this was normal newborn behavior.  Encouraged keeping baby STS until she is regularly feeding.  Mom stated she was exhausted and asked for baby to be swaddled in crib so she can just sleep a little.  Reviewed basics with parents.  Baby swaddled, but trying to suck on her hands and wouldn't fall asleep. Mom agreed to trying again.  Sat Mom up with pillow behind her back.  Placed baby STS on right breast.  Baby latched easily while LC sandwiched breast.  Baby noted to be vigorously sucking, with occasional swallowing identified.  Mom is very happy and FOB is planning to stay awake to watch baby while Mom may fall asleep.  Baby was still breastfeeding 15 mins when left room.   Mom knows to ask for help prn.    Lactation brochure and flyer for virtual support groups left in room.  Mom aware of IP and OP lactation support available.   Mom has a Medela PIS at home.  Maternal Data Formula Feeding for Exclusion: Yes Reason for exclusion: Mother's choice to formula and breast feed on admission Has patient been taught Hand Expression?: Yes Does the patient have breastfeeding experience prior to this delivery?: No  Feeding Feeding Type: Breast Fed  LATCH Score Latch: Grasps breast easily, tongue down, lips flanged,  rhythmical sucking.  Audible Swallowing: A few with stimulation  Type of Nipple: Everted at rest and after stimulation  Comfort (Breast/Nipple): Soft / non-tender  Hold (Positioning): Assistance needed to correctly position infant at breast and maintain latch.  LATCH Score: 8  Interventions Interventions: Breast feeding basics reviewed;Assisted with latch;Skin to skin;Breast massage;Hand express;Support pillows;Adjust position;Breast compression;Position options  Lactation Tools Discussed/Used WIC Program: No   Consult Status Consult Status: Follow-up Date: 12/22/18 Follow-up type: In-patient    Brandi Foster 12/21/2018, 8:56 AM

## 2018-12-21 NOTE — Lactation Note (Signed)
This note was copied from a baby's chart. Lactation Consultation Note  Patient Name: Brandi Foster [id number] Date: 12/21/2018 Reason for consult: Follow-up assessment;Mother's request;Difficult latch;Primapara;1st time breastfeeding(mom has ghtn)  1702 - 1728 - Brandi Foster paged for breast feeding assistance. She states that she is interested in pumping because when she tries to hand express, she does not see any milk come out. She was able to feed baby this morning at the breast, but baby has been sleepy. She gave baby a bottle about 3 hours prior to my entry.  I offered to help. First we reviewed hand expression, and I noted colostrum. Mom's breasts are slightly wide spaced. Her colostrum readily expresses, and mom repeats back well. Mom was relieved to see her milk.  I counseled mom on the purpose of breast pumping and advised her that baby would be able to transfer more milk at the breast. I assisted with latching baby on mom's right breast in football hold. I showed mom and dad how to wake baby, and then mom expressed milk onto her nipple. I showed mom and dad the correct angle to compress her breast tissue.   Baby took the breast with good jaw movement and rhythmic suckling sequences. I showed mom and dad how to gently pester baby to keep her active.  I advised that mom might benefit from a hand pump if she plans to focus on breast feeding. I recommended that she HE five times a day and use her manual pump if there's ever an instance when baby is not latching well or if she is giving formula. I recommended that she pump for 10 minutes on each breast.  Mom was pleased with baby's latch and indicated that she felt comfortable with this plan. We discussed nighttime cluster feeding, and I recommended rest.   Maternal Data Formula Feeding for Exclusion: No Has patient been taught Hand Expression?: Yes Does the patient have breastfeeding experience prior to this delivery?:  No  Feeding Feeding Type: Breast Fed  LATCH Score Latch: Grasps breast easily, tongue down, lips flanged, rhythmical sucking.  Audible Swallowing: A few with stimulation  Type of Nipple: Everted at rest and after stimulation  Comfort (Breast/Nipple): Soft / non-tender  Hold (Positioning): Assistance needed to correctly position infant at breast and maintain latch.  LATCH Score: 8  Interventions Interventions: Breast feeding basics reviewed;Assisted with latch;Skin to skin;Breast massage;Hand express;Breast compression;Support pillows;Hand pump;Expressed milk  Lactation Tools Discussed/Used Pump Review: Setup, frequency, and cleaning Initiated by:: hl Date initiated:: 12/21/18   Consult Status Consult Status: Follow-up Date: 12/22/18 Follow-up type: In-patient    Brandi Foster 12/21/2018, 6:16 PM

## 2018-12-21 NOTE — Progress Notes (Signed)
Patient is eating, ambulating, voiding.  Pain control is good.  Vitals:   12/21/18 0700 12/21/18 0710 12/21/18 0720 12/21/18 0727  BP:    131/73  Pulse:    86  Resp:    17  Temp:    97.8 F (36.6 C)  TempSrc:    Oral  SpO2: 99% 98% 92% 98%  Weight:      Height:        Fundus firm Perineum without swelling.  Lab Results  Component Value Date   WBC 19.4 (H) 12/21/2018   HGB 9.9 (L) 12/21/2018   HCT 29.8 (L) 12/21/2018   MCV 94.6 12/21/2018   PLT 154 12/21/2018    --/--/B POS, B POS Performed at [facility], [address], [place], [place] [address]  (05/15 1125)/RI  A/P Post partum day 0, severe preeclampsia.  S/p two doses of labetalol in labor.  PP BPs ranging from 130s-150s/70-80s- no further meds needed for now.   Magnesium Sulfate for 24 hours- will turn off tomorrow at 0600. Otherwise routine care.  Expect d/c monday.    Loney Laurence

## 2018-12-21 NOTE — Progress Notes (Signed)
MOB was referred for history of depression/anxiety. * Referral screened out by Clinical Social Worker because none of the following criteria appear to apply: ~ History of anxiety/depression during this pregnancy, or of post-partum depression following prior delivery. ~ Diagnosis of anxiety and/or depression within last 3 years OR * MOB's symptoms currently being treated with medication and/or therapy. MOB is currently being treated with Zoloft. No concerns were noted in OB record.   Please contact the Clinical Social Worker if needs arise, by MOB request, or if MOB scores greater than 9/yes to question 10 on Edinburgh Postpartum Depression Screen.  Mashanda Ishibashi Boyd-Gilyard, MSW, LCSW Clinical Social Work [phone] 

## 2018-12-21 NOTE — Anesthesia Postprocedure Evaluation (Signed)
Anesthesia Post Note  Patient: Brandi Foster  Procedure(s) Performed: AN AD HOC LABOR EPIDURAL     Patient location during evaluation: Mother Baby Anesthesia Type: Epidural Level of consciousness: awake and alert Pain management: pain level controlled Vital Signs Assessment: post-procedure vital signs reviewed and stable Respiratory status: spontaneous breathing, nonlabored ventilation and respiratory function stable Cardiovascular status: stable Postop Assessment: no headache, no backache, epidural receding, no apparent nausea or vomiting, patient able to bend at knees, able to ambulate and adequate PO intake Anesthetic complications: no    Last Vitals:  Vitals:   12/21/18 0720 12/21/18 0727  BP:  131/73  Pulse:  86  Resp:  17  Temp:  36.6 C  SpO2: 92% 98%    Last Pain:  Vitals:   12/21/18 0727  TempSrc: Oral  PainSc:    Pain Goal:                   Laban Emperor

## 2018-12-22 MED ORDER — NIFEDIPINE ER OSMOTIC RELEASE 30 MG PO TB24: 30.0000 mg | ORAL_TABLET | Freq: Every day | ORAL | Status: DC

## 2018-12-22 MED ORDER — NIFEDIPINE ER OSMOTIC RELEASE 30 MG PO TB24
30.0000 mg | ORAL_TABLET | Freq: Every day | ORAL | Status: DC
  Administered 2018-12-22 – 2018-12-23 (×2): 30 mg via ORAL
  Filled 2018-12-22 (×2): qty 1

## 2018-12-22 NOTE — Anesthesia Preprocedure Evaluation (Addendum)
Anesthesia Evaluation  Patient identified by MRN, date of birth, ID band Patient awake    Reviewed: Allergy & Precautions, H&P , NPO status , Patient's Chart, lab work & pertinent test results  Airway Mallampati: II  TM Distance: >3 FB Neck ROM: Full    Dental no notable dental hx.    Pulmonary neg pulmonary ROS,    Pulmonary exam normal breath sounds clear to auscultation       Cardiovascular negative cardio ROS Normal cardiovascular exam Rhythm:Regular Rate:Normal     Neuro/Psych negative neurological ROS  negative psych ROS   GI/Hepatic negative GI ROS, Neg liver ROS,   Endo/Other  negative endocrine ROS  Renal/GU negative Renal ROS  negative genitourinary   Musculoskeletal negative musculoskeletal ROS (+)   Abdominal   Peds negative pediatric ROS (+)  Hematology negative hematology ROS (+)   Anesthesia Other Findings   Reproductive/Obstetrics negative OB ROS                             Anesthesia Physical Anesthesia Plan  ASA: II  Anesthesia Plan: Epidural   Post-op Pain Management:    Induction:   PONV Risk Score and Plan:   Airway Management Planned:   Additional Equipment:   Intra-op Plan:   Post-operative Plan:   Informed Consent: I have reviewed the patients History and Physical, chart, labs and discussed the procedure including the risks, benefits and alternatives for the proposed anesthesia with the patient or authorized representative who has indicated his/her understanding and acceptance.     Dental Advisory Given  Plan Discussed with:   Anesthesia Plan Comments: (Labs checked- platelets confirmed with RN in room. Fetal heart tracing, per RN, reported to be stable enough for sitting procedure. Discussed epidural, and patient consents to the procedure:  included risk of possible headache,backache, failed block, allergic reaction, and nerve injury. This  patient was asked if she had any questions or concerns before the procedure started.)        Anesthesia Quick Evaluation

## 2018-12-22 NOTE — Progress Notes (Signed)
Patient is eating, ambulating, voiding.  Pain control is good.  Vitals:   12/21/18 1620 12/21/18 1949 12/21/18 2357 12/22/18 0524  BP: (!) 157/92 (!) 153/72 (!) 153/72 (!) 158/82  Pulse: 89 85 93 81  Resp: 18 16 18 18   Temp: 98.1 F (36.7 C) 98.3 F (36.8 C) 98.6 F (37 C) 98.4 F (36.9 C)  TempSrc: Oral Oral Oral Oral  SpO2: 96% 96% 96% 98%  Weight:      Height:        Fundus firm Perineum without swelling.  Lab Results  Component Value Date   WBC 19.4 (H) 12/21/2018   HGB 9.9 (L) 12/21/2018   HCT 29.8 (L) 12/21/2018   MCV 94.6 12/21/2018   PLT 154 12/21/2018    /RI  A/P Post partum day 2.  BPs over 16 hours have been in 150s/70-90s.  Magnesium stops this am; will start Procardia 30 XL Otherwise routine care.  Expect d/c tomorrow.    Loney Laurence

## 2018-12-22 NOTE — Lactation Note (Signed)
This note was copied from a baby's chart. Lactation Consultation Note  Patient Name: Brandi Foster [id number] Date: 12/22/2018 Reason for consult: Follow-up assessment;Difficult latch;Early term 37-38.6wks;Primapara;1st time breastfeeding  P1 mother whose infant is now 74 hours old.  Baby was awake but not showing feeding cues when I arrived.  Offered to assist with latching since she had not eaten since 0400 (formula fed 30 mls at this time).  Mother happy to receive help.  She stated that she has not been able to bet baby to latch or feed at all from the left breast.  Mother's breasts are soft and non tender and widely spaced.  Nipples are everted and short shafted.  Left nipple and areola slightly bruised.  Assisted baby to latch in the football hold on the left breast.  After a few sucks she became restless and pulled off.  Repeated this a few more times with the same result.  Attempted to burp baby but was unsuccessful.  Attempted the cross cradle position on the same breast.  Again, baby would open and latch well.  She would take 3-4 sucks and become irritated.  Suggested we try the football hold on the other breast and mother agreeable.  Baby displayed a good latch but not interested.  Placed her STS with mother and she calmed down and went to sleep.  Encouraged mother to continue attempting to latch with feeding cues.  Continue doing hand expression before/after feeding to help increase milk supply.  Suggested mother pre-pump to help evert nipples for latching.  She is not interested in wearing a bra today so I will not offer breast shells at this time.  Mother will begin pumping after every feeding attempt to help stimulate breasts.  She will feed back any EBM she obtains to baby.  Informed mother that she may use her EBM to rub into nipples/areolas for comfort and lubrication.    Mother will call for latch assistance as needed.  RN in room prior to feeding to give BP meds since mother's BP  in increased this morning.  Encouraged her to continue doing STS and to try to relax as much as possible.  She is "worried" about baby not being able to latch and feed well.  Explained that baby should begin awakening more today and tonight.  Continue to feed with cues.  Father present and supportive.   Maternal Data Formula Feeding for Exclusion: Yes Reason for exclusion: Mother's choice to formula and breast feed on admission Has patient been taught Hand Expression?: Yes Does the patient have breastfeeding experience prior to this delivery?: No  Feeding Feeding Type: Breast Fed  LATCH Score Latch: Repeated attempts needed to sustain latch, nipple held in mouth throughout feeding, stimulation needed to elicit sucking reflex.  Audible Swallowing: None  Type of Nipple: Everted at rest and after stimulation(short shafted)  Comfort (Breast/Nipple): Soft / non-tender  Hold (Positioning): Assistance needed to correctly position infant at breast and maintain latch.  LATCH Score: 6  Interventions Interventions: Breast feeding basics reviewed;Assisted with latch;Skin to skin;Breast massage;Hand express;Pre-pump if needed;Breast compression;Hand pump;Position options;Support pillows;Adjust position;DEBP  Lactation Tools Discussed/Used Tools: Pump Breast pump type: Double-Electric Breast Pump;Manual Pump Review: Setup, frequency, and cleaning(Reviewed)   Consult Status Consult Status: Follow-up Date: 12/23/18 Follow-up type: In-patient    Ingris Pasquarella R Pinkney Venard 12/22/2018, 8:47 AM

## 2018-12-23 MED ORDER — NIFEDIPINE ER OSMOTIC RELEASE 30 MG PO TB24
60.0000 mg | ORAL_TABLET | Freq: Every day | ORAL | Status: DC
  Administered 2018-12-24: 60 mg via ORAL
  Filled 2018-12-23 (×2): qty 2

## 2018-12-23 MED ORDER — NIFEDIPINE ER OSMOTIC RELEASE 30 MG PO TB24
30.0000 mg | ORAL_TABLET | Freq: Once | ORAL | Status: AC
  Administered 2018-12-23: 30 mg via ORAL
  Filled 2018-12-23: qty 1

## 2018-12-23 NOTE — Progress Notes (Signed)
Patient is doing well.  She is ambulating, voiding, tolerating PO.  Pain control is good.  Lochia is appropriate  Was started on procardia XL 30 mg daily yesterday after MgSO4 discontinued.  BPs improved, but still labile 110s-150s/50-60s. Denies HA/RUQ pain /BV.   Patient and spouse with significant anxiety this morning re: lack of support system at home.    Vitals:   12/22/18 2315 12/22/18 2316 12/23/18 0342 12/23/18 0357  BP:  129/74 (!) 152/65 (!) 116/59  Pulse:  92 75 77  Resp:  19 18   Temp:  97.8 F (36.6 C) 97.9 F (36.6 C)   TempSrc:  Axillary Oral   SpO2: 97% 97% 97%   Weight:      Height:        NAD Fundus firm Ext: 1+ edema bilaterally  Lab Results  Component Value Date   WBC 19.4 (H) 12/21/2018   HGB 9.9 (L) 12/21/2018   HCT 29.8 (L) 12/21/2018   MCV 94.6 12/21/2018   PLT 154 12/21/2018    --/--/B POS, B POS Performed at [facility], [address], [place], [place] [address]  (05/15 1125)/RImmune  A/P 38 y.o. G1P1001 PPD#2 s/p SVD at 37 weeks for preeclampsia, severe by BPs S/p 24 hours of magnesium sulfate prophylaxis postpartum.  Interval improvement in BPs after initiation of procardia Xl, but still labile.  Will continue to monitor BPs today and adjust medication as needed. [facility] GEFFEL [employer]

## 2018-12-23 NOTE — Progress Notes (Signed)
Blood pressures today consistently elevated in the 150s/70s.  Will give an additional dose of nifedipine XL 30mg  now and increase daily dose for tomorrow to 60mg .

## 2018-12-23 NOTE — Lactation Note (Signed)
This note was copied from a baby's chart. Lactation Consultation Note  Patient Name: Brandi Foster [id number] Date: 12/23/2018 Reason for consult: Follow-up assessment;Early term 37-38.6wks;Primapara;1st time breastfeeding  P1 mother whose infant is now 12 hours old.    Baby was asleep on mother's chest doing STS when I arrived.  She appears more jaundiced today and has an 8% weight loss.  Mother continues to work on breast feeding.  Encouraged her to continue awaking baby every three hours if she remains sleepy.  She will feed her more often if she shows feeding cues.  Mother is able to return demonstrate hand expression and is obtaining drops of EBM.  She does not feel like her breasts are getting fuller yet today.  Mother interested in having my assistance with latching even though baby is not currently showing feeding cues.  Demonstrated how to awaken baby and check diaper to try to arouse her.  Mother's breasts are soft and non tender and widely spaced.  Nipples are short shafted and everted.  Assisted baby to latch in the football hold on the right breast.  Baby latched easily but required stimulation to begin sucking.  When she started sucking mother felt a good tug and denied pain.  Baby would suck in short bursts and became fussy.  Removed her from the breast and latched again easily.  She continued the same pattern of sucking in short bursts and then getting restless and pulling off the breast.  Repeated attempts needed but she did continue for 6 minutes.  Baby did not burp after feeding.  Placed her STS on father's chest so mother could use the DEBP and she calmed.  Reviewed the pump and pump parts with mother.  Changed the #24 flange on the right breast to a #27 for greater comfort.  Mother will feed back any EBM she obtains to baby.  Mother's feeding preference has been the bottle with artificial nipple.  She will continue to do hand expression before/after feeding and pumping to help  increase milk supply.  Mother does use the manual pump to help evert nipples prior to latching.  Breast tissue is compressible and baby latches easily.    Mother will be staying an extra day today due to increase BP.  She is hoping to be discharged tomorrow.  Father present and supportive.  Both parents are receptive to learning.  Mother will call for assistance with latching as needed.  RN updated.   Maternal Data Formula Feeding for Exclusion: Yes Reason for exclusion: Mother's choice to formula and breast feed on admission Has patient been taught Hand Expression?: Yes Does the patient have breastfeeding experience prior to this delivery?: No  Feeding Feeding Type: Breast Fed Nipple Type: Slow - flow  LATCH Score Latch: Repeated attempts needed to sustain latch, nipple held in mouth throughout feeding, stimulation needed to elicit sucking reflex.  Audible Swallowing: A few with stimulation  Type of Nipple: Everted at rest and after stimulation(short shafted)  Comfort (Breast/Nipple): Soft / non-tender  Hold (Positioning): Assistance needed to correctly position infant at breast and maintain latch.  LATCH Score: 7  Interventions Interventions: Breast feeding basics reviewed;Assisted with latch;Skin to skin;Breast massage;Hand express;Pre-pump if needed;Breast compression;Hand pump;Expressed milk;Position options;Support pillows;Adjust position;DEBP  Lactation Tools Discussed/Used Tools: Pump;Flanges Flange Size: 27;24 Breast pump type: Double-Electric Breast Pump;Manual Pump Review: Setup, frequency, and cleaning;Milk Storage(Reviewed)   Consult Status Consult Status: Follow-up Date: 12/24/18 Follow-up type: In-patient    Brandi Foster 12/23/2018, 12:27 PM

## 2018-12-23 NOTE — Progress Notes (Signed)
Initial visit with Brandi Foster upon referral from pt's nurse.  [name] was sleeping in the bassinet in pt's room.  Brandi Foster shared that she had a traumatic delivery experience.  She planned to deliver the baby at the end of May and acknowledges that life was already stressful because of life adjustments related to the [place] virus.  Both have been working from home and their parents have been upset as they came to realize it would not be appropriate for them to come visit after the baby was born.    Brandi Foster shared that they found out on Thursday that she would be induced Friday due to high blood pressure.  She felt optimistic upon first arrival at the hospital, but her blood pressure continued to climb and it is her impression that things became critical at a certain point.  She is concerned about what Brandi Foster witnessed (she states she was somewhat out of it).  Brandi Foster acknowledges that he was scared during the delivery as Brandi Foster's blood pressure went up and Brandi Foster heart rate went down.  Brandi Foster shared that she has a therapist and has been in treatment for anxiety before and feels well prepared to handle the stress of the current situation, but is concerned about Brandi Foster, who has had some anxiety attacks during their stay in the hospital.  Brandi Foster acknowledged waking up shaking and tearful and sometimes lying in the fetal position on the couch in Brandi Foster's room.    I validated their experience.  In addition to the stress of havin ga new life to care for, now new parents also have to contend with an unknown and frightening virus, without the support of their network.  In addition their plans shifted rapidly as they worried about Brandi Foster's health, family stress (Brandi Foster also shared that her mother disagreed with the induction), the recent death of their 37 year old cat, and managing all of those circumstances on little sleep due to the middle of the night delivery.  I offered space to process their feelings  about having a new baby, managing family expectations and boundaries, and how to get the support they need without relatives around.  We discussed some stress management techniques, like making a temporary plan to stop anxious spinning about when things would happen, mindfulness, sleep, exercise, setting clear boundaries, and speaking with their physicians if they felt they needed pharmaceutical support.  I also offered Brandi Foster space to share his experience of the delivery separate from [place], so as to not concern her more.  The couple expressed gratitude for the opportunity to talk.  Will continue to follow.  Please page as further needs arise.  Maryanna Shape. Carley Hammed, M.Div. [facility] Chaplain Pager [phone] Office [phone]

## 2018-12-24 MED ORDER — NIFEDIPINE ER 60 MG PO TB24: 60.0000 mg | ORAL_TABLET | Freq: Every day | ORAL | 1 refills | Status: DC

## 2018-12-24 MED ORDER — DOCUSATE SODIUM 100 MG PO CAPS: 100.0000 mg | ORAL_CAPSULE | Freq: Two times a day (BID) | ORAL | 0 refills | Status: DC

## 2018-12-24 MED ORDER — IBUPROFEN 600 MG PO TABS: 600.0000 mg | ORAL_TABLET | Freq: Four times a day (QID) | ORAL | 0 refills | Status: DC | PRN

## 2018-12-24 NOTE — Progress Notes (Signed)
Discharge instructions and prescriptions given to pt. Discussed post vaginal delivery care, signs and symptoms to report to the MD, upcoming appointments, and meds. Pt verbalizes understanding and has no questions or concerns at this time. Pt discharged from hospital with baby in stable condition. °

## 2018-12-24 NOTE — Discharge Summary (Signed)
Obstetric Discharge Summary Reason for Admission: induction of labor Prenatal Procedures: NST and ultrasound Intrapartum Procedures: spontaneous vaginal delivery Postpartum Procedures: none Complications-Operative and Postpartum: 2nd degree perineal laceration Hemoglobin  Date Value Ref Range Status  12/21/2018 9.9 (L) 12.0 - 15.0 g/dL Final   HCT  Date Value Ref Range Status  12/21/2018 29.8 (L) 36.0 - 46.0 % Final    Physical Exam:  General: alert, cooperative and appears stated age 38: appropriate Uterine Fundus: firm DVT Evaluation: No evidence of DVT seen on physical exam.  Discharge Diagnoses: Term Pregnancy-delivered  Discharge Information: Date: 12/24/2018 Activity: pelvic rest Diet: routine Medications: Ibuprofen, Colace and Procardia XL 60mg  Condition: improved Instructions: refer to practice specific booklet Discharge to: home Follow-up Information    Waynard Reeds, MD Follow up in 2 day(s).   Specialty:  Obstetrics and Gynecology Why:  For a BP check Contact information: [address] [place] [address] [phone]           Newborn Data: Live born female  Birth Weight: 6 lb 10.5 oz (3019 g) APGAR: 7, 9  Newborn Delivery   Birth date/time:  12/21/2018 01:35:00 Delivery type:  Vaginal, Spontaneous     Home with mother.  Waynard Reeds 12/24/2018, 11:09 AM

## 2018-12-24 NOTE — Lactation Note (Signed)
This note was copied from a baby's chart. Lactation Consultation Note:  Infant is 1 hours old and is 37.2 weeks. Mother was finishing a feeding when I arrived in the room . Infant in cradle hold with latch sliding to the tip of the nipple. Nipple pinched and showed this to mother. Discussed using cross cradle and football with good pillow support. Advised mother to latch infant with off sideded latch with chin tucked deep into bottom of breast.   Mother has been supplementing with ebm and formula.  Mother has her own PIS at home and has been using the [employer] pump while in  The hospital.  Mother has pumped every 2-3 hours. Staff nurse gave mother a hand pump.  Assist mother with hand expression. Observed flow of breastmilk. Informed mother of the change in the color. Mothers breast are still soft. After palpation by [facility] mother doesn't have any breast tenderness or fullness.  Lots of support and encouragement given. Mother concerned about making milk . Encouraged to continue to pump after each feeding and do good breast massage.  Lots of teaching and answering all mothers questions.   Discussed cluster feeding and making sure that infant gets enough calories. Discussed continued cue base feeding. Advised to feed infant 8-12 times in 24 hours. Mother has high anxiety and has many questions.   Discussed treatment and prevention of engorgement.  Mother request shells. I suggested expressed breastmilk and then comfort gels. I gave shells. Mother to put bra on and try shells.   Mother wants more follow up and suggested OP dept. Visit. Mother agreeable to follow up .     Patient Name: Girl Veryle Dethloff CBJSE'G Date: 12/24/2018 Reason for consult: Follow-up assessment   Maternal Data Has patient been taught Hand Expression?: (assist with hand expression and observed large flow of milk)  Feeding Feeding Type: Breast Fed  LATCH Score Latch: Grasps breast easily, tongue down, lips flanged,  rhythmical sucking.  Audible Swallowing: A few with stimulation  Type of Nipple: Everted at rest and after stimulation  Comfort (Breast/Nipple): Filling, red/small blisters or bruises, mild/mod discomfort(tiny crack on the left nipple)  Hold (Positioning): Assistance needed to correctly position infant at breast and maintain latch.(mother in traditional cradle with chin far from breast)  LATCH Score: 7  Interventions Interventions: Breast feeding basics reviewed;Hand express;Reverse pressure;Breast compression;Adjust position;Support pillows;Position options;Expressed milk;Shells;Comfort gels;Hand pump  Lactation Tools Discussed/Used     Consult Status Consult Status: Complete    Michel Bickers 12/24/2018, 12:38 PM

## 2018-12-26 DIAGNOSIS — F411 Generalized anxiety disorder: Secondary | ICD-10-CM | POA: Diagnosis not present

## 2019-01-14 DIAGNOSIS — F411 Generalized anxiety disorder: Secondary | ICD-10-CM | POA: Diagnosis not present

## 2019-01-14 DIAGNOSIS — F329 Major depressive disorder, single episode, unspecified: Secondary | ICD-10-CM | POA: Diagnosis not present

## 2019-01-14 DIAGNOSIS — F410 Panic disorder [episodic paroxysmal anxiety] without agoraphobia: Secondary | ICD-10-CM | POA: Diagnosis not present

## 2019-03-26 DIAGNOSIS — F411 Generalized anxiety disorder: Secondary | ICD-10-CM | POA: Diagnosis not present

## 2019-05-15 DIAGNOSIS — F411 Generalized anxiety disorder: Secondary | ICD-10-CM | POA: Diagnosis not present

## 2019-05-15 DIAGNOSIS — F410 Panic disorder [episodic paroxysmal anxiety] without agoraphobia: Secondary | ICD-10-CM | POA: Diagnosis not present

## 2019-05-15 DIAGNOSIS — F329 Major depressive disorder, single episode, unspecified: Secondary | ICD-10-CM | POA: Diagnosis not present

## 2019-07-31 ENCOUNTER — Ambulatory Visit: Payer: BC Managed Care – PPO | Attending: Internal Medicine

## 2019-07-31 DIAGNOSIS — Z20828 Contact with and (suspected) exposure to other viral communicable diseases: Secondary | ICD-10-CM | POA: Insufficient documentation

## 2019-07-31 DIAGNOSIS — Z20822 Contact with and (suspected) exposure to covid-19: Secondary | ICD-10-CM

## 2019-08-01 LAB — NOVEL CORONAVIRUS, NAA: SARS-CoV-2, NAA: NOT DETECTED

## 2019-08-15 ENCOUNTER — Ambulatory Visit: Payer: BC Managed Care – PPO | Attending: Internal Medicine

## 2019-08-15 DIAGNOSIS — Z20822 Contact with and (suspected) exposure to covid-19: Secondary | ICD-10-CM | POA: Diagnosis not present

## 2019-08-17 LAB — NOVEL CORONAVIRUS, NAA: SARS-CoV-2, NAA: NOT DETECTED

## 2019-08-21 ENCOUNTER — Ambulatory Visit: Payer: BC Managed Care – PPO | Attending: Internal Medicine

## 2019-08-21 DIAGNOSIS — Z20822 Contact with and (suspected) exposure to covid-19: Secondary | ICD-10-CM

## 2019-08-22 LAB — NOVEL CORONAVIRUS, NAA: SARS-CoV-2, NAA: NOT DETECTED

## 2019-09-01 DIAGNOSIS — F411 Generalized anxiety disorder: Secondary | ICD-10-CM | POA: Diagnosis not present

## 2019-09-01 DIAGNOSIS — F329 Major depressive disorder, single episode, unspecified: Secondary | ICD-10-CM | POA: Diagnosis not present

## 2019-09-01 DIAGNOSIS — F410 Panic disorder [episodic paroxysmal anxiety] without agoraphobia: Secondary | ICD-10-CM | POA: Diagnosis not present

## 2019-09-18 DIAGNOSIS — F329 Major depressive disorder, single episode, unspecified: Secondary | ICD-10-CM | POA: Diagnosis not present

## 2019-09-18 DIAGNOSIS — F410 Panic disorder [episodic paroxysmal anxiety] without agoraphobia: Secondary | ICD-10-CM | POA: Diagnosis not present

## 2019-09-18 DIAGNOSIS — F411 Generalized anxiety disorder: Secondary | ICD-10-CM | POA: Diagnosis not present

## 2019-09-19 DIAGNOSIS — Z20828 Contact with and (suspected) exposure to other viral communicable diseases: Secondary | ICD-10-CM | POA: Diagnosis not present

## 2019-10-09 DIAGNOSIS — F410 Panic disorder [episodic paroxysmal anxiety] without agoraphobia: Secondary | ICD-10-CM | POA: Diagnosis not present

## 2019-10-09 DIAGNOSIS — F411 Generalized anxiety disorder: Secondary | ICD-10-CM | POA: Diagnosis not present

## 2019-10-09 DIAGNOSIS — F329 Major depressive disorder, single episode, unspecified: Secondary | ICD-10-CM | POA: Diagnosis not present

## 2019-10-30 DIAGNOSIS — F410 Panic disorder [episodic paroxysmal anxiety] without agoraphobia: Secondary | ICD-10-CM | POA: Diagnosis not present

## 2019-10-30 DIAGNOSIS — F329 Major depressive disorder, single episode, unspecified: Secondary | ICD-10-CM | POA: Diagnosis not present

## 2019-10-30 DIAGNOSIS — F411 Generalized anxiety disorder: Secondary | ICD-10-CM | POA: Diagnosis not present

## 2019-11-11 DIAGNOSIS — Z01419 Encounter for gynecological examination (general) (routine) without abnormal findings: Secondary | ICD-10-CM | POA: Diagnosis not present

## 2019-11-11 DIAGNOSIS — Z6836 Body mass index (BMI) 36.0-36.9, adult: Secondary | ICD-10-CM | POA: Diagnosis not present

## 2019-11-18 DIAGNOSIS — F411 Generalized anxiety disorder: Secondary | ICD-10-CM | POA: Diagnosis not present

## 2019-11-18 DIAGNOSIS — F410 Panic disorder [episodic paroxysmal anxiety] without agoraphobia: Secondary | ICD-10-CM | POA: Diagnosis not present

## 2019-11-18 DIAGNOSIS — F329 Major depressive disorder, single episode, unspecified: Secondary | ICD-10-CM | POA: Diagnosis not present

## 2019-12-04 DIAGNOSIS — F410 Panic disorder [episodic paroxysmal anxiety] without agoraphobia: Secondary | ICD-10-CM | POA: Diagnosis not present

## 2019-12-04 DIAGNOSIS — F329 Major depressive disorder, single episode, unspecified: Secondary | ICD-10-CM | POA: Diagnosis not present

## 2019-12-04 DIAGNOSIS — F411 Generalized anxiety disorder: Secondary | ICD-10-CM | POA: Diagnosis not present

## 2020-01-13 DIAGNOSIS — H43823 Vitreomacular adhesion, bilateral: Secondary | ICD-10-CM | POA: Diagnosis not present

## 2020-01-13 DIAGNOSIS — H30113 Disseminated chorioretinal inflammation of posterior pole, bilateral: Secondary | ICD-10-CM | POA: Diagnosis not present

## 2020-01-13 DIAGNOSIS — H31093 Other chorioretinal scars, bilateral: Secondary | ICD-10-CM | POA: Diagnosis not present

## 2020-01-13 DIAGNOSIS — H35433 Paving stone degeneration of retina, bilateral: Secondary | ICD-10-CM | POA: Diagnosis not present

## 2020-01-26 DIAGNOSIS — F411 Generalized anxiety disorder: Secondary | ICD-10-CM | POA: Diagnosis not present

## 2020-01-26 DIAGNOSIS — F329 Major depressive disorder, single episode, unspecified: Secondary | ICD-10-CM | POA: Diagnosis not present

## 2020-01-26 DIAGNOSIS — F410 Panic disorder [episodic paroxysmal anxiety] without agoraphobia: Secondary | ICD-10-CM | POA: Diagnosis not present

## 2020-02-12 DIAGNOSIS — F329 Major depressive disorder, single episode, unspecified: Secondary | ICD-10-CM | POA: Diagnosis not present

## 2020-02-12 DIAGNOSIS — F411 Generalized anxiety disorder: Secondary | ICD-10-CM | POA: Diagnosis not present

## 2020-02-12 DIAGNOSIS — F410 Panic disorder [episodic paroxysmal anxiety] without agoraphobia: Secondary | ICD-10-CM | POA: Diagnosis not present

## 2020-02-24 DIAGNOSIS — F410 Panic disorder [episodic paroxysmal anxiety] without agoraphobia: Secondary | ICD-10-CM | POA: Diagnosis not present

## 2020-02-24 DIAGNOSIS — F411 Generalized anxiety disorder: Secondary | ICD-10-CM | POA: Diagnosis not present

## 2020-02-24 DIAGNOSIS — F329 Major depressive disorder, single episode, unspecified: Secondary | ICD-10-CM | POA: Diagnosis not present

## 2020-03-31 DIAGNOSIS — F411 Generalized anxiety disorder: Secondary | ICD-10-CM | POA: Diagnosis not present

## 2020-03-31 DIAGNOSIS — M549 Dorsalgia, unspecified: Secondary | ICD-10-CM | POA: Diagnosis not present

## 2020-03-31 DIAGNOSIS — Z0000 Encounter for general adult medical examination without abnormal findings: Secondary | ICD-10-CM | POA: Diagnosis not present

## 2020-03-31 DIAGNOSIS — Z13220 Encounter for screening for lipoid disorders: Secondary | ICD-10-CM | POA: Diagnosis not present

## 2020-03-31 DIAGNOSIS — L299 Pruritus, unspecified: Secondary | ICD-10-CM | POA: Diagnosis not present

## 2020-04-07 DIAGNOSIS — F329 Major depressive disorder, single episode, unspecified: Secondary | ICD-10-CM | POA: Diagnosis not present

## 2020-04-07 DIAGNOSIS — F411 Generalized anxiety disorder: Secondary | ICD-10-CM | POA: Diagnosis not present

## 2020-04-07 DIAGNOSIS — F410 Panic disorder [episodic paroxysmal anxiety] without agoraphobia: Secondary | ICD-10-CM | POA: Diagnosis not present

## 2020-04-21 DIAGNOSIS — F410 Panic disorder [episodic paroxysmal anxiety] without agoraphobia: Secondary | ICD-10-CM | POA: Diagnosis not present

## 2020-04-21 DIAGNOSIS — F329 Major depressive disorder, single episode, unspecified: Secondary | ICD-10-CM | POA: Diagnosis not present

## 2020-04-21 DIAGNOSIS — F411 Generalized anxiety disorder: Secondary | ICD-10-CM | POA: Diagnosis not present

## 2020-05-05 DIAGNOSIS — F410 Panic disorder [episodic paroxysmal anxiety] without agoraphobia: Secondary | ICD-10-CM | POA: Diagnosis not present

## 2020-05-05 DIAGNOSIS — F329 Major depressive disorder, single episode, unspecified: Secondary | ICD-10-CM | POA: Diagnosis not present

## 2020-05-05 DIAGNOSIS — F411 Generalized anxiety disorder: Secondary | ICD-10-CM | POA: Diagnosis not present

## 2020-05-19 DIAGNOSIS — F411 Generalized anxiety disorder: Secondary | ICD-10-CM | POA: Diagnosis not present

## 2020-05-19 DIAGNOSIS — F329 Major depressive disorder, single episode, unspecified: Secondary | ICD-10-CM | POA: Diagnosis not present

## 2020-05-19 DIAGNOSIS — F410 Panic disorder [episodic paroxysmal anxiety] without agoraphobia: Secondary | ICD-10-CM | POA: Diagnosis not present

## 2020-06-01 DIAGNOSIS — F32A Depression, unspecified: Secondary | ICD-10-CM | POA: Diagnosis not present

## 2020-06-01 DIAGNOSIS — Z6838 Body mass index (BMI) 38.0-38.9, adult: Secondary | ICD-10-CM | POA: Diagnosis not present

## 2020-06-03 DIAGNOSIS — F411 Generalized anxiety disorder: Secondary | ICD-10-CM | POA: Diagnosis not present

## 2020-06-03 DIAGNOSIS — F410 Panic disorder [episodic paroxysmal anxiety] without agoraphobia: Secondary | ICD-10-CM | POA: Diagnosis not present

## 2020-06-03 DIAGNOSIS — F329 Major depressive disorder, single episode, unspecified: Secondary | ICD-10-CM | POA: Diagnosis not present

## 2020-06-21 DIAGNOSIS — F329 Major depressive disorder, single episode, unspecified: Secondary | ICD-10-CM | POA: Diagnosis not present

## 2020-06-21 DIAGNOSIS — F411 Generalized anxiety disorder: Secondary | ICD-10-CM | POA: Diagnosis not present

## 2020-06-21 DIAGNOSIS — F410 Panic disorder [episodic paroxysmal anxiety] without agoraphobia: Secondary | ICD-10-CM | POA: Diagnosis not present

## 2020-07-02 DIAGNOSIS — R059 Cough, unspecified: Secondary | ICD-10-CM | POA: Diagnosis not present

## 2020-07-02 DIAGNOSIS — Z03818 Encounter for observation for suspected exposure to other biological agents ruled out: Secondary | ICD-10-CM | POA: Diagnosis not present

## 2020-07-02 DIAGNOSIS — R0981 Nasal congestion: Secondary | ICD-10-CM | POA: Diagnosis not present

## 2020-07-05 DIAGNOSIS — J0100 Acute maxillary sinusitis, unspecified: Secondary | ICD-10-CM | POA: Diagnosis not present

## 2020-07-06 DIAGNOSIS — F410 Panic disorder [episodic paroxysmal anxiety] without agoraphobia: Secondary | ICD-10-CM | POA: Diagnosis not present

## 2020-07-06 DIAGNOSIS — F411 Generalized anxiety disorder: Secondary | ICD-10-CM | POA: Diagnosis not present

## 2020-07-06 DIAGNOSIS — F329 Major depressive disorder, single episode, unspecified: Secondary | ICD-10-CM | POA: Diagnosis not present

## 2020-07-13 DIAGNOSIS — F32A Depression, unspecified: Secondary | ICD-10-CM | POA: Diagnosis not present

## 2020-07-14 DIAGNOSIS — H30113 Disseminated chorioretinal inflammation of posterior pole, bilateral: Secondary | ICD-10-CM | POA: Diagnosis not present

## 2020-07-14 DIAGNOSIS — H31093 Other chorioretinal scars, bilateral: Secondary | ICD-10-CM | POA: Diagnosis not present

## 2020-08-06 DIAGNOSIS — Z03818 Encounter for observation for suspected exposure to other biological agents ruled out: Secondary | ICD-10-CM | POA: Diagnosis not present

## 2020-08-11 DIAGNOSIS — Z03818 Encounter for observation for suspected exposure to other biological agents ruled out: Secondary | ICD-10-CM | POA: Diagnosis not present

## 2020-08-19 DIAGNOSIS — F329 Major depressive disorder, single episode, unspecified: Secondary | ICD-10-CM | POA: Diagnosis not present

## 2020-08-19 DIAGNOSIS — F410 Panic disorder [episodic paroxysmal anxiety] without agoraphobia: Secondary | ICD-10-CM | POA: Diagnosis not present

## 2020-08-19 DIAGNOSIS — F411 Generalized anxiety disorder: Secondary | ICD-10-CM | POA: Diagnosis not present

## 2020-09-01 DIAGNOSIS — J019 Acute sinusitis, unspecified: Secondary | ICD-10-CM | POA: Diagnosis not present

## 2020-09-15 DIAGNOSIS — F329 Major depressive disorder, single episode, unspecified: Secondary | ICD-10-CM | POA: Diagnosis not present

## 2020-09-15 DIAGNOSIS — F411 Generalized anxiety disorder: Secondary | ICD-10-CM | POA: Diagnosis not present

## 2020-09-15 DIAGNOSIS — F410 Panic disorder [episodic paroxysmal anxiety] without agoraphobia: Secondary | ICD-10-CM | POA: Diagnosis not present

## 2020-09-17 DIAGNOSIS — R42 Dizziness and giddiness: Secondary | ICD-10-CM | POA: Diagnosis not present

## 2020-09-20 DIAGNOSIS — Z8249 Family history of ischemic heart disease and other diseases of the circulatory system: Secondary | ICD-10-CM | POA: Diagnosis not present

## 2020-09-20 DIAGNOSIS — R2681 Unsteadiness on feet: Secondary | ICD-10-CM | POA: Diagnosis not present

## 2020-09-20 DIAGNOSIS — R42 Dizziness and giddiness: Secondary | ICD-10-CM | POA: Diagnosis not present

## 2020-09-27 ENCOUNTER — Other Ambulatory Visit: Payer: Self-pay | Admitting: Family Medicine

## 2020-09-28 ENCOUNTER — Other Ambulatory Visit: Payer: Self-pay | Admitting: Family Medicine

## 2020-09-28 DIAGNOSIS — R42 Dizziness and giddiness: Secondary | ICD-10-CM

## 2020-09-28 DIAGNOSIS — Z8249 Family history of ischemic heart disease and other diseases of the circulatory system: Secondary | ICD-10-CM

## 2020-09-29 DIAGNOSIS — D225 Melanocytic nevi of trunk: Secondary | ICD-10-CM | POA: Diagnosis not present

## 2020-09-29 DIAGNOSIS — L718 Other rosacea: Secondary | ICD-10-CM | POA: Diagnosis not present

## 2020-09-29 DIAGNOSIS — D239 Other benign neoplasm of skin, unspecified: Secondary | ICD-10-CM | POA: Diagnosis not present

## 2020-09-29 DIAGNOSIS — L110 Acquired keratosis follicularis: Secondary | ICD-10-CM | POA: Diagnosis not present

## 2020-10-01 ENCOUNTER — Other Ambulatory Visit: Payer: Self-pay

## 2020-10-01 ENCOUNTER — Ambulatory Visit
Admission: RE | Admit: 2020-10-01 | Discharge: 2020-10-01 | Disposition: A | Discharge status: Home / Self Care | Payer: BC Managed Care – PPO | Source: Ambulatory Visit | Attending: Family Medicine | Admitting: Family Medicine

## 2020-10-01 DIAGNOSIS — Z8249 Family history of ischemic heart disease and other diseases of the circulatory system: Secondary | ICD-10-CM

## 2020-10-01 DIAGNOSIS — R42 Dizziness and giddiness: Secondary | ICD-10-CM

## 2020-10-01 IMAGING — MR MR HEAD WO/W CM
13 series · 48 of 48 positions shown · IV contrast (multihance)
Comparison: None.

CLINICAL DATA: Headaches and disorientation.  Dizziness.

EXAM:
MRI HEAD WITHOUT AND WITH CONTRAST
TECHNIQUE: Multiplanar, multiecho pulse sequences of the brain and surrounding
structures were obtained without and with intravenous contrast.
CONTRAST:  13mL MULTIHANCE GADOBENATE DIMEGLUMINE 529 MG/ML IV SOLN

[Series 5: T1 · sagittal · 4.0mm · 0.75mm/px · 2 of 31 slices shown (1 of 3)]
[im 1/31]
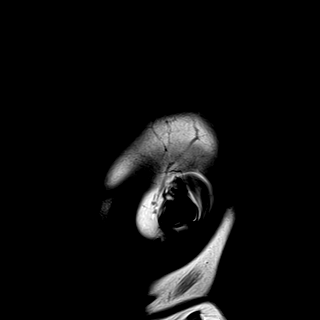
[im 31/31]
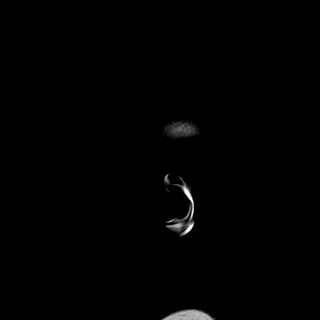

[Series 6: DWI · axial · 3.0mm · 0.94mm/px · z∈[-68,+71]mm · 8 of 160 slices shown (1 of 3)]
[im 1/160]
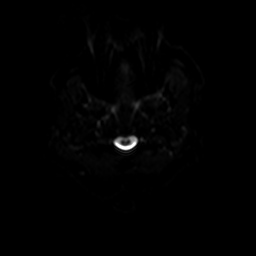
[im 23/160]
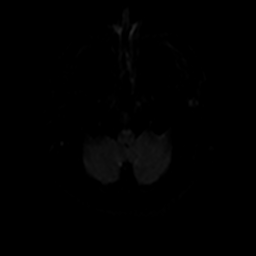
[im 46/160]
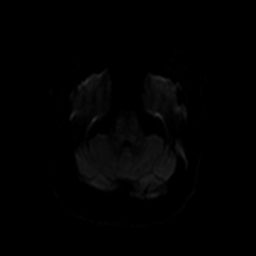
[im 69/160]
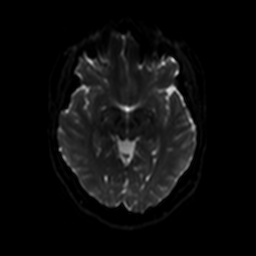
[im 91/160]
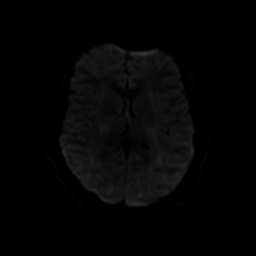
[im 114/160]
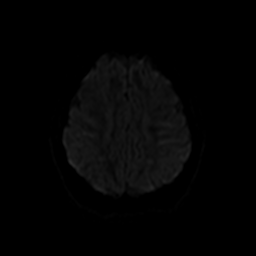
[im 137/160]
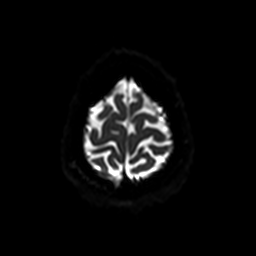
[im 160/160]
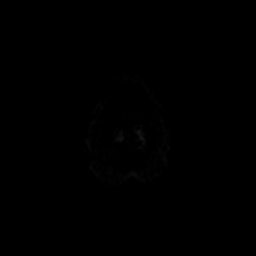

[Series 7: ax dwi_tracew · axial · 3.0mm · 0.94mm/px · z∈[-68,+71]mm · 4 of 80 slices shown]
[im 1/80]
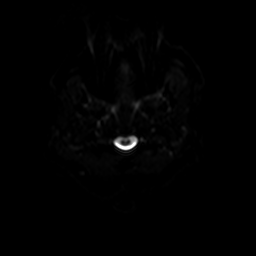
[im 27/80]
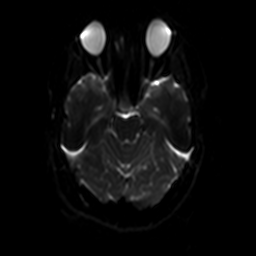
[im 53/80]
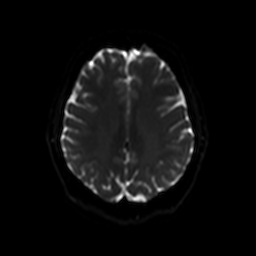
[im 80/80]
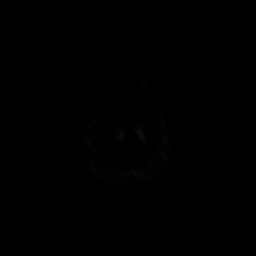

[Series 8: ax dwi_adc · axial · 3.0mm · 0.94mm/px · z∈[-68,+71]mm · 2 of 40 slices shown]
[im 1/40]
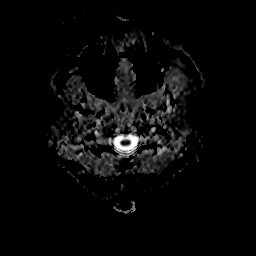
[im 40/40]
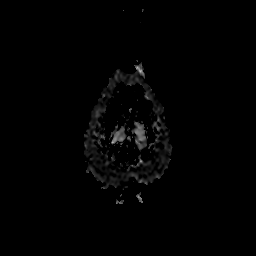

[Series 9: DWI · coronal · 5.0mm · 1.44mm/px · 3 of 64 slices shown (2 of 3)]
[im 1/64]
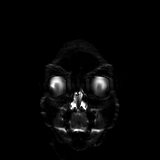
[im 32/64]
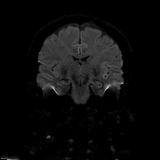
[im 64/64]
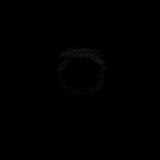

[Series 10: DWI · coronal · 5.0mm · 1.44mm/px · 2 of 32 slices shown (3 of 3)]
[im 1/32]
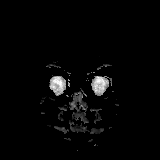
[im 32/32]
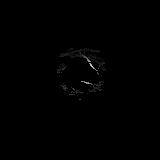

[Series 11: T2 · axial · 4.0mm · 0.36mm/px · 1 of 29 slices shown]
[im 1/29]
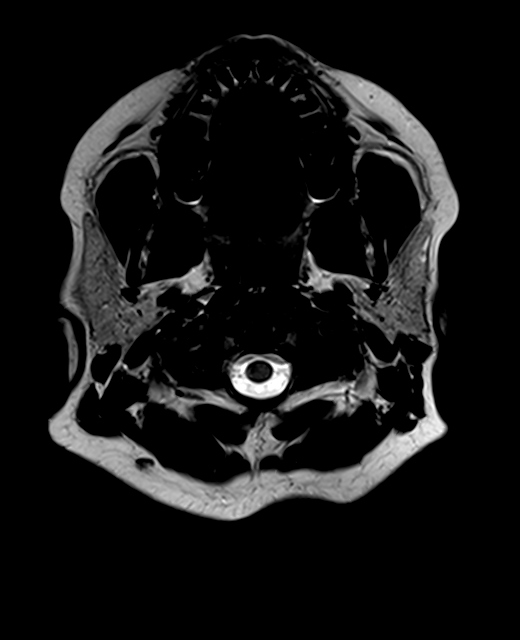

[Series 12: FLAIR · axial · 3.0mm · 0.72mm/px · 1 of 26 slices shown]
[im 1/26]
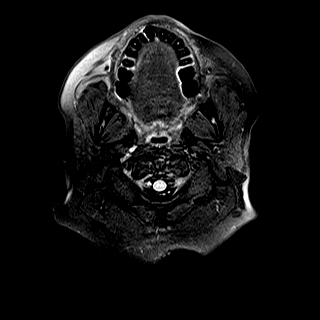

[Series 14: swi_images · axial · 1.5mm · 0.90mm/px · z∈[-68,+74]mm · 5 of 96 slices shown]
[im 1/96]
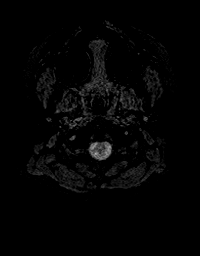
[im 24/96]
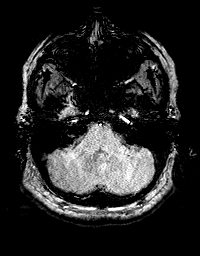
[im 48/96]
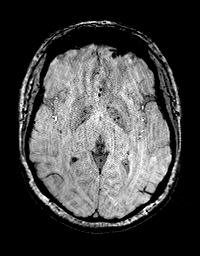
[im 72/96]
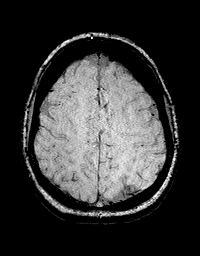
[im 96/96]
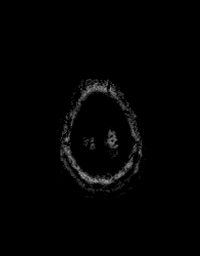

[Series 15: T1 · axial · 1.0mm · 0.94mm/px · z∈[-87,+71]mm · 8 of 160 slices shown (2 of 3)]
[im 1/160]
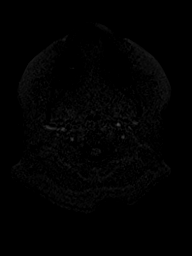
[im 23/160]
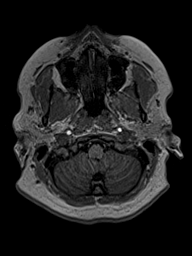
[im 46/160]
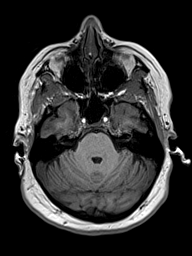
[im 69/160]
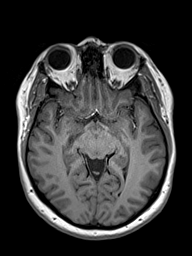
[im 91/160]
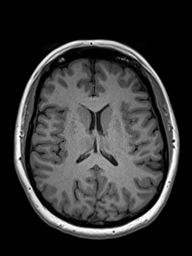
[im 114/160]
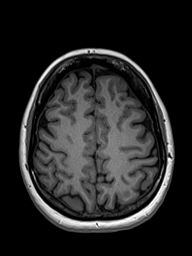
[im 137/160]
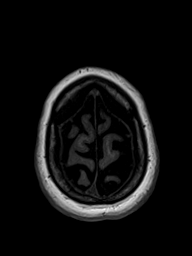
[im 160/160]
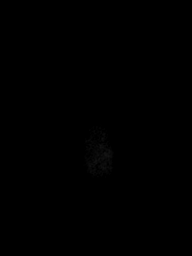

[Series 16: T2 post-contrast · coronal · 4.0mm · 0.36mm/px · 2 of 33 slices shown]
[im 1/33]
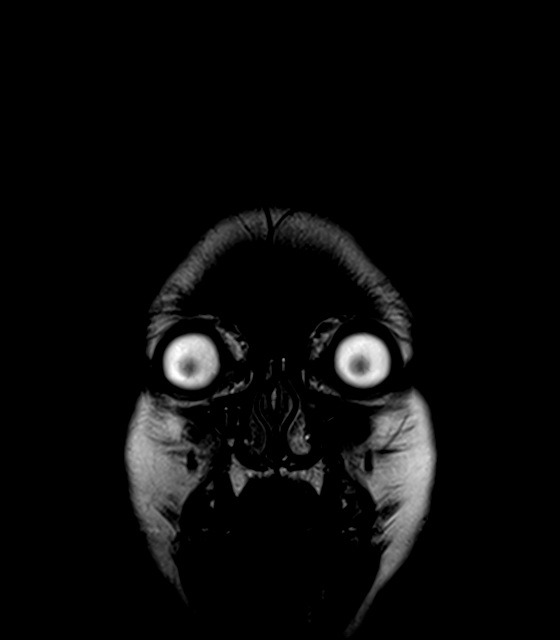
[im 33/33]
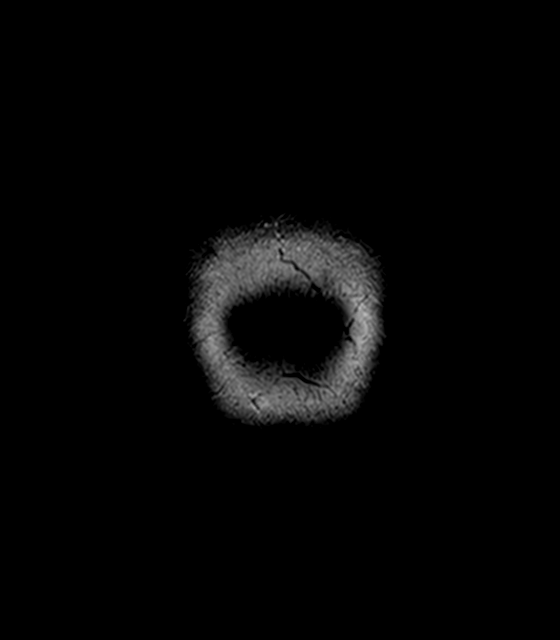

[Series 17: T1 · axial · 1.0mm · 0.94mm/px · z∈[-87,+71]mm · 8 of 160 slices shown (3 of 3)]
[im 1/160]
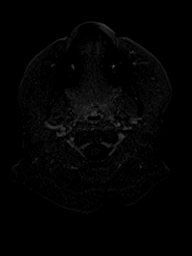
[im 23/160]
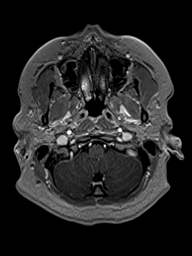
[im 46/160]
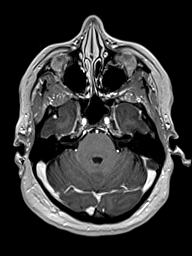
[im 69/160]
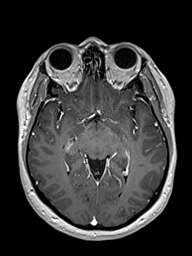
[im 91/160]
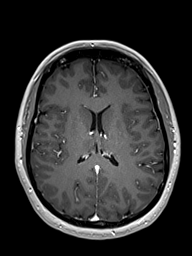
[im 114/160]
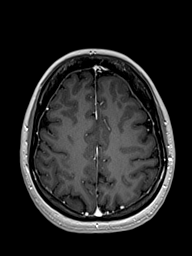
[im 137/160]
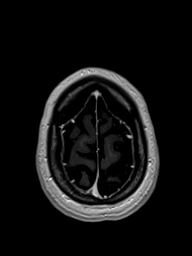
[im 160/160]
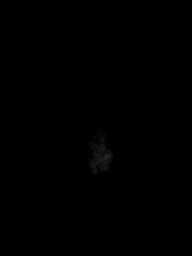

[Series 18: T1 post-contrast · coronal · 4.0mm · 0.72mm/px · 2 of 33 slices shown]
[im 1/33]
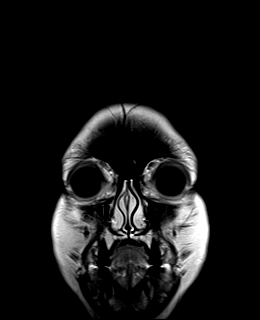
[im 33/33]
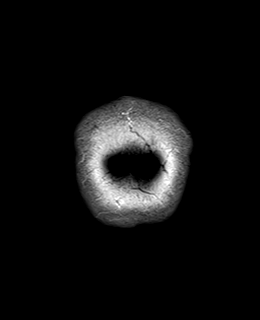

[48 of 48 positions shown; findings below may reference images not displayed]

FINDINGS: Brain: No acute infarction, hemorrhage, hydrocephalus, extra-axial
collection or mass lesion. No abnormal enhancement.

Vascular: Major arterial flow voids are maintained at the skull
base.

Skull and upper cervical spine: Normal marrow signal.

Sinuses/Orbits: Mucosal thickening and small amount of fluid within
the left sphenoid sinus. Remaining sinuses are largely clear.
Unremarkable orbits.

Other: No mastoid effusions.
IMPRESSION: No acute intracranial abnormality.

## 2020-10-01 MED ORDER — GADOBENATE DIMEGLUMINE 529 MG/ML IV SOLN
13.0000 mL | Freq: Once | INTRAVENOUS | Status: AC | PRN
  Administered 2020-10-01: 13 mL via INTRAVENOUS

## 2020-10-06 DIAGNOSIS — F410 Panic disorder [episodic paroxysmal anxiety] without agoraphobia: Secondary | ICD-10-CM | POA: Diagnosis not present

## 2020-10-06 DIAGNOSIS — F411 Generalized anxiety disorder: Secondary | ICD-10-CM | POA: Diagnosis not present

## 2020-10-06 DIAGNOSIS — R42 Dizziness and giddiness: Secondary | ICD-10-CM | POA: Diagnosis not present

## 2020-10-06 DIAGNOSIS — R519 Headache, unspecified: Secondary | ICD-10-CM | POA: Diagnosis not present

## 2020-10-06 DIAGNOSIS — F329 Major depressive disorder, single episode, unspecified: Secondary | ICD-10-CM | POA: Diagnosis not present

## 2020-10-08 DIAGNOSIS — Z03818 Encounter for observation for suspected exposure to other biological agents ruled out: Secondary | ICD-10-CM | POA: Diagnosis not present

## 2020-10-08 DIAGNOSIS — Z20822 Contact with and (suspected) exposure to covid-19: Secondary | ICD-10-CM | POA: Diagnosis not present

## 2020-11-01 DIAGNOSIS — F329 Major depressive disorder, single episode, unspecified: Secondary | ICD-10-CM | POA: Diagnosis not present

## 2020-11-01 DIAGNOSIS — F410 Panic disorder [episodic paroxysmal anxiety] without agoraphobia: Secondary | ICD-10-CM | POA: Diagnosis not present

## 2020-11-01 DIAGNOSIS — F411 Generalized anxiety disorder: Secondary | ICD-10-CM | POA: Diagnosis not present

## 2020-11-17 DIAGNOSIS — F329 Major depressive disorder, single episode, unspecified: Secondary | ICD-10-CM | POA: Diagnosis not present

## 2020-11-17 DIAGNOSIS — F410 Panic disorder [episodic paroxysmal anxiety] without agoraphobia: Secondary | ICD-10-CM | POA: Diagnosis not present

## 2020-11-17 DIAGNOSIS — F411 Generalized anxiety disorder: Secondary | ICD-10-CM | POA: Diagnosis not present

## 2020-12-03 DIAGNOSIS — Z01419 Encounter for gynecological examination (general) (routine) without abnormal findings: Secondary | ICD-10-CM | POA: Diagnosis not present

## 2020-12-13 DIAGNOSIS — J321 Chronic frontal sinusitis: Secondary | ICD-10-CM | POA: Diagnosis not present

## 2020-12-16 DIAGNOSIS — Z6839 Body mass index (BMI) 39.0-39.9, adult: Secondary | ICD-10-CM | POA: Diagnosis not present

## 2020-12-16 DIAGNOSIS — Z30430 Encounter for insertion of intrauterine contraceptive device: Secondary | ICD-10-CM | POA: Diagnosis not present

## 2020-12-16 DIAGNOSIS — Z3202 Encounter for pregnancy test, result negative: Secondary | ICD-10-CM | POA: Diagnosis not present

## 2021-01-01 DIAGNOSIS — Z03818 Encounter for observation for suspected exposure to other biological agents ruled out: Secondary | ICD-10-CM | POA: Diagnosis not present

## 2021-01-04 ENCOUNTER — Encounter: Payer: Self-pay | Admitting: Podiatry

## 2021-01-04 ENCOUNTER — Ambulatory Visit: Payer: BC Managed Care – PPO | Admitting: Podiatry

## 2021-01-04 ENCOUNTER — Other Ambulatory Visit: Payer: Self-pay

## 2021-01-04 ENCOUNTER — Ambulatory Visit (INDEPENDENT_AMBULATORY_CARE_PROVIDER_SITE_OTHER): Payer: BC Managed Care – PPO

## 2021-01-04 ENCOUNTER — Other Ambulatory Visit: Payer: Self-pay | Admitting: Podiatry

## 2021-01-04 DIAGNOSIS — E6609 Other obesity due to excess calories: Secondary | ICD-10-CM | POA: Insufficient documentation

## 2021-01-04 DIAGNOSIS — G5762 Lesion of plantar nerve, left lower limb: Secondary | ICD-10-CM | POA: Diagnosis not present

## 2021-01-04 DIAGNOSIS — J321 Chronic frontal sinusitis: Secondary | ICD-10-CM | POA: Insufficient documentation

## 2021-01-04 DIAGNOSIS — F419 Anxiety disorder, unspecified: Secondary | ICD-10-CM | POA: Insufficient documentation

## 2021-01-04 DIAGNOSIS — G5782 Other specified mononeuropathies of left lower limb: Secondary | ICD-10-CM

## 2021-01-04 DIAGNOSIS — E559 Vitamin D deficiency, unspecified: Secondary | ICD-10-CM | POA: Insufficient documentation

## 2021-01-04 DIAGNOSIS — N762 Acute vulvitis: Secondary | ICD-10-CM | POA: Insufficient documentation

## 2021-01-04 DIAGNOSIS — J309 Allergic rhinitis, unspecified: Secondary | ICD-10-CM | POA: Insufficient documentation

## 2021-01-04 DIAGNOSIS — M2042 Other hammer toe(s) (acquired), left foot: Secondary | ICD-10-CM

## 2021-01-04 MED ORDER — TRIAMCINOLONE ACETONIDE 40 MG/ML IJ SUSP
20.0000 mg | Freq: Once | INTRAMUSCULAR | Status: AC
  Administered 2021-01-04: 20 mg

## 2021-01-04 NOTE — Progress Notes (Signed)
  Subjective:  Patient ID: Brandi Foster, female    DOB: 1980/10/25,  MRN: [id number] HPI Chief Complaint  Patient presents with  . Foot Pain    Plantar forefoot left - fracture 6 years ago, unknown at the time, aching now x 3 weeks consistently, weird sensations in toes and lateral foot, tried stretching, ice and heat  . New Patient (Initial Visit)    40 y.o. female presents with the above complaint.   ROS: Denies fever chills nausea vomiting muscle aches pains calf pain back pain chest pain shortness of breath.  Past Medical History:  Diagnosis Date  . Allergy   . Anxiety   . Depression    Past Surgical History:  Procedure Laterality Date  . FRACTURE SURGERY    . JOINT REPLACEMENT      Current Outpatient Medications:  .  cetirizine (ZYRTEC) 10 MG tablet, Take 10 mg by mouth daily., Disp: , Rfl:  .  Levonorgestrel 13.5 MG IUD, by Intrauterine route., Disp: , Rfl:  .  Prenatal Vit-Fe Fumarate-FA (PRENATAL MULTIVITAMIN) TABS tablet, Take 1 tablet by mouth daily at 12 noon., Disp: , Rfl:  .  sertraline (ZOLOFT) 50 MG tablet, Take 25 mg by mouth at bedtime., Disp: , Rfl:   Allergies  Allergen Reactions  . Codeine Other (See Comments)    Was told as a child she was allergic  . Other     Artifical sweetener   . Sulphadimidine [Sulfamethazine] Other (See Comments)    Was told as a child she was allergic to it.    Review of Systems Objective:  There were no vitals filed for this visit.  General: Well developed, nourished, in no acute distress, alert and oriented x3   Dermatological: Skin is warm, dry and supple bilateral. Nails x 10 are well maintained; remaining integument appears unremarkable at this time. There are no open sores, no preulcerative lesions, no rash or signs of infection present.  Vascular: Dorsalis Pedis artery and Posterior Tibial artery pedal pulses are 2/4 bilateral with immedate capillary fill time. Pedal hair growth present. No varicosities and no  lower extremity edema present bilateral.   Neruologic: Grossly intact via light touch bilateral. Vibratory intact via tuning fork bilateral. Protective threshold with Semmes Wienstein monofilament intact to all pedal sites bilateral. Patellar and Achilles deep tendon reflexes 2+ bilateral. No Babinski or clonus noted bilateral.   Musculoskeletal: No gross boney pedal deformities bilateral. No pain, crepitus, or limitation noted with foot and ankle range of motion bilateral. Muscular strength 5/5 in all groups tested bilateral.  Palpable neuroma third interdigital space of the left foot.  Gait: Unassisted, Nonantalgic.    Radiographs:  Radiographs taken today do not demonstrate any acute findings with this osseously mature adult  Assessment & Plan:   Assessment: Neuroma third interdigital space left foot.  Plan: Injected 10 mg of Kenalog 5 mg Marcaine point maximal tenderness of the third interdigital space left.  But follow-up with her in 1 month discussed appropriate shoe gear stretching exercise ice type shoe gear modifications.     Zamani Crocker T. [place], [place]

## 2021-02-03 ENCOUNTER — Encounter: Payer: Self-pay | Admitting: Podiatry

## 2021-02-03 ENCOUNTER — Ambulatory Visit: Payer: BC Managed Care – PPO | Admitting: Podiatry

## 2021-02-03 ENCOUNTER — Other Ambulatory Visit: Payer: Self-pay

## 2021-02-03 DIAGNOSIS — G5762 Lesion of plantar nerve, left lower limb: Secondary | ICD-10-CM

## 2021-02-03 DIAGNOSIS — Z30431 Encounter for routine checking of intrauterine contraceptive device: Secondary | ICD-10-CM | POA: Diagnosis not present

## 2021-02-03 DIAGNOSIS — G5782 Other specified mononeuropathies of left lower limb: Secondary | ICD-10-CM

## 2021-02-03 MED ORDER — DEXAMETHASONE SODIUM PHOSPHATE 120 MG/30ML IJ SOLN
2.0000 mg | Freq: Once | INTRAMUSCULAR | Status: AC
  Administered 2021-02-03: 2 mg via INTRA_ARTICULAR

## 2021-02-03 NOTE — Progress Notes (Signed)
She presents today for follow-up of her neuroma third interdigital space of her left foot.  There is pain that has popped back up she would like to have it taken care of before she leaves for [place] for anniversary party.  Objective: Vital signs are stable alert oriented x3.  Palpable Mulder's click third interdigital space of the left foot.  Assessment: Recurrence of neuroma.  Plan: Reinjected today dexamethasone local anesthetic should this not resolve it 100% we will start dehydrated alcohol injections and we discussed that today.

## 2021-03-10 ENCOUNTER — Other Ambulatory Visit: Payer: Self-pay

## 2021-03-10 ENCOUNTER — Encounter: Payer: Self-pay | Admitting: Podiatry

## 2021-03-10 ENCOUNTER — Ambulatory Visit: Payer: BC Managed Care – PPO | Admitting: Podiatry

## 2021-03-10 DIAGNOSIS — G5782 Other specified mononeuropathies of left lower limb: Secondary | ICD-10-CM

## 2021-03-10 DIAGNOSIS — G5762 Lesion of plantar nerve, left lower limb: Secondary | ICD-10-CM

## 2021-03-11 DIAGNOSIS — J309 Allergic rhinitis, unspecified: Secondary | ICD-10-CM | POA: Diagnosis not present

## 2021-03-11 DIAGNOSIS — J019 Acute sinusitis, unspecified: Secondary | ICD-10-CM | POA: Diagnosis not present

## 2021-03-11 DIAGNOSIS — J209 Acute bronchitis, unspecified: Secondary | ICD-10-CM | POA: Diagnosis not present

## 2021-03-13 NOTE — Progress Notes (Signed)
She presents today for follow-up of her neuroma.  States that last time she was and we injected with dexamethasone and he really feels about the same as it did.  Objective: Vital signs are stable she alert and orient x3 still has pain on palpation third interdigital space of the left foot there does not appear to be as severe as it has been in the past.  Assessment: Neuroma third interspace left foot.  Plan: Injected her first dose of dehydrated alcohol today.  Follow-up with her in 3 weeks.

## 2021-03-14 DIAGNOSIS — Z20822 Contact with and (suspected) exposure to covid-19: Secondary | ICD-10-CM | POA: Diagnosis not present

## 2021-04-12 ENCOUNTER — Other Ambulatory Visit: Payer: Self-pay

## 2021-04-12 ENCOUNTER — Ambulatory Visit: Payer: BC Managed Care – PPO | Admitting: Podiatry

## 2021-04-12 ENCOUNTER — Encounter: Payer: Self-pay | Admitting: Podiatry

## 2021-04-12 DIAGNOSIS — G5762 Lesion of plantar nerve, left lower limb: Secondary | ICD-10-CM | POA: Diagnosis not present

## 2021-04-12 DIAGNOSIS — G5782 Other specified mononeuropathies of left lower limb: Secondary | ICD-10-CM

## 2021-04-12 NOTE — Progress Notes (Signed)
She presents today for number follow-up of her neuroma third interdigital space of her left foot.  States that it really feels better but she still is having pain.  Objective: Vital signs stable oriented x3 pain on palpation third interspace left foot.  Assessment: Neuroma third interspace left foot.  Plan: I injected second dose of dehydrated alcohol with 1/2 cc utilized third interspace left foot.

## 2021-04-14 DIAGNOSIS — J343 Hypertrophy of nasal turbinates: Secondary | ICD-10-CM | POA: Diagnosis not present

## 2021-04-14 DIAGNOSIS — H93293 Other abnormal auditory perceptions, bilateral: Secondary | ICD-10-CM | POA: Diagnosis not present

## 2021-04-14 DIAGNOSIS — H6983 Other specified disorders of Eustachian tube, bilateral: Secondary | ICD-10-CM | POA: Diagnosis not present

## 2021-04-14 DIAGNOSIS — J323 Chronic sphenoidal sinusitis: Secondary | ICD-10-CM | POA: Diagnosis not present

## 2021-04-14 DIAGNOSIS — J342 Deviated nasal septum: Secondary | ICD-10-CM | POA: Diagnosis not present

## 2021-04-18 DIAGNOSIS — Z13220 Encounter for screening for lipoid disorders: Secondary | ICD-10-CM | POA: Diagnosis not present

## 2021-04-18 DIAGNOSIS — Z0000 Encounter for general adult medical examination without abnormal findings: Secondary | ICD-10-CM | POA: Diagnosis not present

## 2021-04-18 DIAGNOSIS — F411 Generalized anxiety disorder: Secondary | ICD-10-CM | POA: Diagnosis not present

## 2021-05-03 ENCOUNTER — Encounter: Payer: Self-pay | Admitting: Podiatry

## 2021-05-03 ENCOUNTER — Ambulatory Visit: Payer: BC Managed Care – PPO | Admitting: Podiatry

## 2021-05-03 ENCOUNTER — Other Ambulatory Visit: Payer: Self-pay

## 2021-05-03 DIAGNOSIS — G5782 Other specified mononeuropathies of left lower limb: Secondary | ICD-10-CM

## 2021-05-03 DIAGNOSIS — G5762 Lesion of plantar nerve, left lower limb: Secondary | ICD-10-CM | POA: Diagnosis not present

## 2021-05-03 NOTE — Progress Notes (Signed)
She presents today for follow-up of her neuroma third interdigital space left foot.  After her second injection she states that she is considerably better than she was but still has days that are bad.  Objective: Still has palpable pain third interdigital space of the left foot but not nearly as tender as it has been in the past.  Pulses remain palpable no open lesions or wounds no thinning of the skin over the third interdigital space.  Assessment: Neuroma resolving third interdigital space left.  Plan: We will inject her third dose of dehydrated alcohol to the left foot today third interdigital space follow-up with her in 4 weeks

## 2021-05-18 DIAGNOSIS — J310 Chronic rhinitis: Secondary | ICD-10-CM | POA: Diagnosis not present

## 2021-05-18 DIAGNOSIS — J343 Hypertrophy of nasal turbinates: Secondary | ICD-10-CM | POA: Diagnosis not present

## 2021-05-18 DIAGNOSIS — J342 Deviated nasal septum: Secondary | ICD-10-CM | POA: Diagnosis not present

## 2021-05-31 ENCOUNTER — Other Ambulatory Visit: Payer: Self-pay

## 2021-05-31 ENCOUNTER — Encounter: Payer: Self-pay | Admitting: Podiatry

## 2021-05-31 ENCOUNTER — Ambulatory Visit: Payer: BC Managed Care – PPO | Admitting: Podiatry

## 2021-05-31 DIAGNOSIS — G5782 Other specified mononeuropathies of left lower limb: Secondary | ICD-10-CM

## 2021-05-31 NOTE — Progress Notes (Signed)
She presents today for follow-up of her neuroma third interspace left foot.  States that is doing a lot better than it was initially but some days are still really painful.  Objective: Vital signs are stable alert and oriented x3.  Pulses are palpable.  She has pain on palpation to the third interdigital space of the left foot.  No open lesions or wounds.  Assessment: Neuroma third interdigital space slowly resolving left.  Plan: Injected her fourth dose of dehydrated alcohol to the third interdigital space and I will follow-up with her in 1 month

## 2021-06-01 DIAGNOSIS — Z23 Encounter for immunization: Secondary | ICD-10-CM | POA: Diagnosis not present

## 2021-06-03 DIAGNOSIS — M25552 Pain in left hip: Secondary | ICD-10-CM | POA: Diagnosis not present

## 2021-06-03 DIAGNOSIS — M5432 Sciatica, left side: Secondary | ICD-10-CM | POA: Diagnosis not present

## 2021-06-08 DIAGNOSIS — M6281 Muscle weakness (generalized): Secondary | ICD-10-CM | POA: Diagnosis not present

## 2021-06-08 DIAGNOSIS — M25652 Stiffness of left hip, not elsewhere classified: Secondary | ICD-10-CM | POA: Diagnosis not present

## 2021-06-08 DIAGNOSIS — S76012D Strain of muscle, fascia and tendon of left hip, subsequent encounter: Secondary | ICD-10-CM | POA: Diagnosis not present

## 2021-06-14 DIAGNOSIS — M25652 Stiffness of left hip, not elsewhere classified: Secondary | ICD-10-CM | POA: Diagnosis not present

## 2021-06-14 DIAGNOSIS — M6281 Muscle weakness (generalized): Secondary | ICD-10-CM | POA: Diagnosis not present

## 2021-06-14 DIAGNOSIS — S76012D Strain of muscle, fascia and tendon of left hip, subsequent encounter: Secondary | ICD-10-CM | POA: Diagnosis not present

## 2021-06-16 DIAGNOSIS — M25652 Stiffness of left hip, not elsewhere classified: Secondary | ICD-10-CM | POA: Diagnosis not present

## 2021-06-16 DIAGNOSIS — M6281 Muscle weakness (generalized): Secondary | ICD-10-CM | POA: Diagnosis not present

## 2021-06-16 DIAGNOSIS — S76012D Strain of muscle, fascia and tendon of left hip, subsequent encounter: Secondary | ICD-10-CM | POA: Diagnosis not present

## 2021-06-20 DIAGNOSIS — M25652 Stiffness of left hip, not elsewhere classified: Secondary | ICD-10-CM | POA: Diagnosis not present

## 2021-06-20 DIAGNOSIS — S76012D Strain of muscle, fascia and tendon of left hip, subsequent encounter: Secondary | ICD-10-CM | POA: Diagnosis not present

## 2021-06-20 DIAGNOSIS — M6281 Muscle weakness (generalized): Secondary | ICD-10-CM | POA: Diagnosis not present

## 2021-06-22 DIAGNOSIS — S76012D Strain of muscle, fascia and tendon of left hip, subsequent encounter: Secondary | ICD-10-CM | POA: Diagnosis not present

## 2021-06-22 DIAGNOSIS — M6281 Muscle weakness (generalized): Secondary | ICD-10-CM | POA: Diagnosis not present

## 2021-06-22 DIAGNOSIS — M25652 Stiffness of left hip, not elsewhere classified: Secondary | ICD-10-CM | POA: Diagnosis not present

## 2021-06-27 DIAGNOSIS — M25652 Stiffness of left hip, not elsewhere classified: Secondary | ICD-10-CM | POA: Diagnosis not present

## 2021-06-27 DIAGNOSIS — M6281 Muscle weakness (generalized): Secondary | ICD-10-CM | POA: Diagnosis not present

## 2021-06-27 DIAGNOSIS — S76012D Strain of muscle, fascia and tendon of left hip, subsequent encounter: Secondary | ICD-10-CM | POA: Diagnosis not present

## 2021-06-28 ENCOUNTER — Ambulatory Visit: Payer: BC Managed Care – PPO | Admitting: Podiatry

## 2021-06-28 ENCOUNTER — Other Ambulatory Visit: Payer: Self-pay

## 2021-06-28 DIAGNOSIS — G5782 Other specified mononeuropathies of left lower limb: Secondary | ICD-10-CM

## 2021-06-28 NOTE — Progress Notes (Signed)
She presents today for follow-up of her neuroma states that is doing much much better she states that she is not far from it being completed at this point.  She presents today for her fifth dehydrated alcohol dose.  Objective: Vital signs are stable alert and oriented x3 there is no erythema edema cellulitis drainage or odor she still has pain on palpation but no Mulder's click to the third interdigital space of the left foot.  Assessment: Neuroma third interdigital space resolving with dehydrated alcohol.  Plan: Injected her fifth dose of dehydrated alcohol today and I will follow-up with her in 4 weeks.  She tolerated procedure well without complications after sterile Betadine skin prep.

## 2021-07-04 DIAGNOSIS — M6281 Muscle weakness (generalized): Secondary | ICD-10-CM | POA: Diagnosis not present

## 2021-07-04 DIAGNOSIS — S76012D Strain of muscle, fascia and tendon of left hip, subsequent encounter: Secondary | ICD-10-CM | POA: Diagnosis not present

## 2021-07-04 DIAGNOSIS — M25652 Stiffness of left hip, not elsewhere classified: Secondary | ICD-10-CM | POA: Diagnosis not present

## 2021-07-06 DIAGNOSIS — M6281 Muscle weakness (generalized): Secondary | ICD-10-CM | POA: Diagnosis not present

## 2021-07-06 DIAGNOSIS — M25652 Stiffness of left hip, not elsewhere classified: Secondary | ICD-10-CM | POA: Diagnosis not present

## 2021-07-06 DIAGNOSIS — S76012D Strain of muscle, fascia and tendon of left hip, subsequent encounter: Secondary | ICD-10-CM | POA: Diagnosis not present

## 2021-07-11 DIAGNOSIS — M6281 Muscle weakness (generalized): Secondary | ICD-10-CM | POA: Diagnosis not present

## 2021-07-11 DIAGNOSIS — M25652 Stiffness of left hip, not elsewhere classified: Secondary | ICD-10-CM | POA: Diagnosis not present

## 2021-07-11 DIAGNOSIS — S76012D Strain of muscle, fascia and tendon of left hip, subsequent encounter: Secondary | ICD-10-CM | POA: Diagnosis not present

## 2021-07-13 DIAGNOSIS — M25652 Stiffness of left hip, not elsewhere classified: Secondary | ICD-10-CM | POA: Diagnosis not present

## 2021-07-13 DIAGNOSIS — S76012D Strain of muscle, fascia and tendon of left hip, subsequent encounter: Secondary | ICD-10-CM | POA: Diagnosis not present

## 2021-07-13 DIAGNOSIS — M6281 Muscle weakness (generalized): Secondary | ICD-10-CM | POA: Diagnosis not present

## 2021-07-19 DIAGNOSIS — M6281 Muscle weakness (generalized): Secondary | ICD-10-CM | POA: Diagnosis not present

## 2021-07-19 DIAGNOSIS — M25652 Stiffness of left hip, not elsewhere classified: Secondary | ICD-10-CM | POA: Diagnosis not present

## 2021-07-19 DIAGNOSIS — S76012D Strain of muscle, fascia and tendon of left hip, subsequent encounter: Secondary | ICD-10-CM | POA: Diagnosis not present

## 2021-07-21 DIAGNOSIS — S76012D Strain of muscle, fascia and tendon of left hip, subsequent encounter: Secondary | ICD-10-CM | POA: Diagnosis not present

## 2021-07-21 DIAGNOSIS — M25652 Stiffness of left hip, not elsewhere classified: Secondary | ICD-10-CM | POA: Diagnosis not present

## 2021-07-21 DIAGNOSIS — M6281 Muscle weakness (generalized): Secondary | ICD-10-CM | POA: Diagnosis not present

## 2021-07-25 DIAGNOSIS — M6281 Muscle weakness (generalized): Secondary | ICD-10-CM | POA: Diagnosis not present

## 2021-07-25 DIAGNOSIS — S76012D Strain of muscle, fascia and tendon of left hip, subsequent encounter: Secondary | ICD-10-CM | POA: Diagnosis not present

## 2021-07-25 DIAGNOSIS — M25652 Stiffness of left hip, not elsewhere classified: Secondary | ICD-10-CM | POA: Diagnosis not present

## 2021-07-26 ENCOUNTER — Encounter: Payer: Self-pay | Admitting: Podiatry

## 2021-07-26 ENCOUNTER — Other Ambulatory Visit: Payer: Self-pay

## 2021-07-26 ENCOUNTER — Ambulatory Visit: Payer: BC Managed Care – PPO | Admitting: Podiatry

## 2021-07-26 DIAGNOSIS — G5782 Other specified mononeuropathies of left lower limb: Secondary | ICD-10-CM | POA: Diagnosis not present

## 2021-07-26 NOTE — Progress Notes (Signed)
She presents today for her follow-up of her fifth alcohol injection she states that she is about 75% better as she refers to her third interdigital space on her left foot.  She states that it hurts if not every day every other day but is much more minimal than it used to be.  Objective: Vital signs are stable alert and oriented x3 palpable Mulder's click third interspace of the left foot not as tender.  Assessment: Neuroma third interdigital space left foot.  Plan: Injected her sixth dose of dehydrated alcohol to the third interdigital space of the left foot.  Follow-up with her in 1 month.  She tolerated procedure well.

## 2021-07-27 DIAGNOSIS — M25652 Stiffness of left hip, not elsewhere classified: Secondary | ICD-10-CM | POA: Diagnosis not present

## 2021-07-27 DIAGNOSIS — M6281 Muscle weakness (generalized): Secondary | ICD-10-CM | POA: Diagnosis not present

## 2021-07-27 DIAGNOSIS — S76012D Strain of muscle, fascia and tendon of left hip, subsequent encounter: Secondary | ICD-10-CM | POA: Diagnosis not present

## 2021-08-10 DIAGNOSIS — S76012D Strain of muscle, fascia and tendon of left hip, subsequent encounter: Secondary | ICD-10-CM | POA: Diagnosis not present

## 2021-08-10 DIAGNOSIS — M25652 Stiffness of left hip, not elsewhere classified: Secondary | ICD-10-CM | POA: Diagnosis not present

## 2021-08-10 DIAGNOSIS — M6281 Muscle weakness (generalized): Secondary | ICD-10-CM | POA: Diagnosis not present

## 2021-08-15 DIAGNOSIS — S76012D Strain of muscle, fascia and tendon of left hip, subsequent encounter: Secondary | ICD-10-CM | POA: Diagnosis not present

## 2021-08-15 DIAGNOSIS — M6281 Muscle weakness (generalized): Secondary | ICD-10-CM | POA: Diagnosis not present

## 2021-08-15 DIAGNOSIS — M25652 Stiffness of left hip, not elsewhere classified: Secondary | ICD-10-CM | POA: Diagnosis not present

## 2021-08-19 DIAGNOSIS — M6281 Muscle weakness (generalized): Secondary | ICD-10-CM | POA: Diagnosis not present

## 2021-08-19 DIAGNOSIS — M25652 Stiffness of left hip, not elsewhere classified: Secondary | ICD-10-CM | POA: Diagnosis not present

## 2021-08-19 DIAGNOSIS — S76012D Strain of muscle, fascia and tendon of left hip, subsequent encounter: Secondary | ICD-10-CM | POA: Diagnosis not present

## 2021-08-23 ENCOUNTER — Encounter: Payer: Self-pay | Admitting: Podiatry

## 2021-08-23 ENCOUNTER — Ambulatory Visit: Payer: BC Managed Care – PPO | Admitting: Podiatry

## 2021-08-23 ENCOUNTER — Other Ambulatory Visit: Payer: Self-pay

## 2021-08-23 DIAGNOSIS — G5782 Other specified mononeuropathies of left lower limb: Secondary | ICD-10-CM | POA: Diagnosis not present

## 2021-08-23 NOTE — Progress Notes (Signed)
She presents today states that I am here for follow-up of my neuroma is doing a lot better I think 1 more shot will do it.  She is referring to the third interdigital space of her left foot.  Objective: Vital signs are stable she alert and oriented x3.  She still has some mild tenderness on the distalmost aspect of the intermetatarsal space.  Assessment: Resolving neuroma third interdigital space left foot.  Plan: Injected her seventh dose of dehydrated alcohol to the third interspace of the left foot and I will follow-up with her in 1 more month if necessary.

## 2021-08-24 DIAGNOSIS — S76012D Strain of muscle, fascia and tendon of left hip, subsequent encounter: Secondary | ICD-10-CM | POA: Diagnosis not present

## 2021-08-24 DIAGNOSIS — M6281 Muscle weakness (generalized): Secondary | ICD-10-CM | POA: Diagnosis not present

## 2021-08-24 DIAGNOSIS — M25652 Stiffness of left hip, not elsewhere classified: Secondary | ICD-10-CM | POA: Diagnosis not present

## 2021-08-26 DIAGNOSIS — M25652 Stiffness of left hip, not elsewhere classified: Secondary | ICD-10-CM | POA: Diagnosis not present

## 2021-08-26 DIAGNOSIS — M6281 Muscle weakness (generalized): Secondary | ICD-10-CM | POA: Diagnosis not present

## 2021-08-26 DIAGNOSIS — S76012D Strain of muscle, fascia and tendon of left hip, subsequent encounter: Secondary | ICD-10-CM | POA: Diagnosis not present

## 2021-08-29 DIAGNOSIS — M25652 Stiffness of left hip, not elsewhere classified: Secondary | ICD-10-CM | POA: Diagnosis not present

## 2021-08-29 DIAGNOSIS — S76012D Strain of muscle, fascia and tendon of left hip, subsequent encounter: Secondary | ICD-10-CM | POA: Diagnosis not present

## 2021-08-29 DIAGNOSIS — M6281 Muscle weakness (generalized): Secondary | ICD-10-CM | POA: Diagnosis not present

## 2021-08-31 DIAGNOSIS — M6281 Muscle weakness (generalized): Secondary | ICD-10-CM | POA: Diagnosis not present

## 2021-08-31 DIAGNOSIS — S76012D Strain of muscle, fascia and tendon of left hip, subsequent encounter: Secondary | ICD-10-CM | POA: Diagnosis not present

## 2021-08-31 DIAGNOSIS — M25652 Stiffness of left hip, not elsewhere classified: Secondary | ICD-10-CM | POA: Diagnosis not present

## 2021-09-06 DIAGNOSIS — F331 Major depressive disorder, recurrent, moderate: Secondary | ICD-10-CM | POA: Diagnosis not present

## 2021-09-06 DIAGNOSIS — F411 Generalized anxiety disorder: Secondary | ICD-10-CM | POA: Diagnosis not present

## 2021-09-06 DIAGNOSIS — F410 Panic disorder [episodic paroxysmal anxiety] without agoraphobia: Secondary | ICD-10-CM | POA: Diagnosis not present

## 2021-09-07 DIAGNOSIS — M25652 Stiffness of left hip, not elsewhere classified: Secondary | ICD-10-CM | POA: Diagnosis not present

## 2021-09-07 DIAGNOSIS — S76012D Strain of muscle, fascia and tendon of left hip, subsequent encounter: Secondary | ICD-10-CM | POA: Diagnosis not present

## 2021-09-07 DIAGNOSIS — M6281 Muscle weakness (generalized): Secondary | ICD-10-CM | POA: Diagnosis not present

## 2021-09-09 DIAGNOSIS — S76012D Strain of muscle, fascia and tendon of left hip, subsequent encounter: Secondary | ICD-10-CM | POA: Diagnosis not present

## 2021-09-09 DIAGNOSIS — M6281 Muscle weakness (generalized): Secondary | ICD-10-CM | POA: Diagnosis not present

## 2021-09-09 DIAGNOSIS — M25652 Stiffness of left hip, not elsewhere classified: Secondary | ICD-10-CM | POA: Diagnosis not present

## 2021-09-12 DIAGNOSIS — S76012D Strain of muscle, fascia and tendon of left hip, subsequent encounter: Secondary | ICD-10-CM | POA: Diagnosis not present

## 2021-09-12 DIAGNOSIS — M6281 Muscle weakness (generalized): Secondary | ICD-10-CM | POA: Diagnosis not present

## 2021-09-12 DIAGNOSIS — M25652 Stiffness of left hip, not elsewhere classified: Secondary | ICD-10-CM | POA: Diagnosis not present

## 2021-09-14 DIAGNOSIS — S76012D Strain of muscle, fascia and tendon of left hip, subsequent encounter: Secondary | ICD-10-CM | POA: Diagnosis not present

## 2021-09-14 DIAGNOSIS — M6281 Muscle weakness (generalized): Secondary | ICD-10-CM | POA: Diagnosis not present

## 2021-09-14 DIAGNOSIS — F410 Panic disorder [episodic paroxysmal anxiety] without agoraphobia: Secondary | ICD-10-CM | POA: Diagnosis not present

## 2021-09-14 DIAGNOSIS — F331 Major depressive disorder, recurrent, moderate: Secondary | ICD-10-CM | POA: Diagnosis not present

## 2021-09-14 DIAGNOSIS — M25652 Stiffness of left hip, not elsewhere classified: Secondary | ICD-10-CM | POA: Diagnosis not present

## 2021-09-14 DIAGNOSIS — F411 Generalized anxiety disorder: Secondary | ICD-10-CM | POA: Diagnosis not present

## 2021-09-21 DIAGNOSIS — M25652 Stiffness of left hip, not elsewhere classified: Secondary | ICD-10-CM | POA: Diagnosis not present

## 2021-09-21 DIAGNOSIS — S76012D Strain of muscle, fascia and tendon of left hip, subsequent encounter: Secondary | ICD-10-CM | POA: Diagnosis not present

## 2021-09-21 DIAGNOSIS — M6281 Muscle weakness (generalized): Secondary | ICD-10-CM | POA: Diagnosis not present

## 2021-09-23 DIAGNOSIS — M25652 Stiffness of left hip, not elsewhere classified: Secondary | ICD-10-CM | POA: Diagnosis not present

## 2021-09-23 DIAGNOSIS — M6281 Muscle weakness (generalized): Secondary | ICD-10-CM | POA: Diagnosis not present

## 2021-09-23 DIAGNOSIS — S76012D Strain of muscle, fascia and tendon of left hip, subsequent encounter: Secondary | ICD-10-CM | POA: Diagnosis not present

## 2021-09-27 ENCOUNTER — Ambulatory Visit: Payer: BC Managed Care – PPO | Admitting: Podiatry

## 2021-09-28 DIAGNOSIS — S76012D Strain of muscle, fascia and tendon of left hip, subsequent encounter: Secondary | ICD-10-CM | POA: Diagnosis not present

## 2021-09-28 DIAGNOSIS — M25652 Stiffness of left hip, not elsewhere classified: Secondary | ICD-10-CM | POA: Diagnosis not present

## 2021-09-28 DIAGNOSIS — M6281 Muscle weakness (generalized): Secondary | ICD-10-CM | POA: Diagnosis not present

## 2021-09-29 DIAGNOSIS — L02421 Furuncle of right axilla: Secondary | ICD-10-CM | POA: Diagnosis not present

## 2021-09-29 DIAGNOSIS — L02422 Furuncle of left axilla: Secondary | ICD-10-CM | POA: Diagnosis not present

## 2021-09-29 DIAGNOSIS — L821 Other seborrheic keratosis: Secondary | ICD-10-CM | POA: Diagnosis not present

## 2021-09-29 DIAGNOSIS — L814 Other melanin hyperpigmentation: Secondary | ICD-10-CM | POA: Diagnosis not present

## 2021-09-29 DIAGNOSIS — F410 Panic disorder [episodic paroxysmal anxiety] without agoraphobia: Secondary | ICD-10-CM | POA: Diagnosis not present

## 2021-09-29 DIAGNOSIS — F411 Generalized anxiety disorder: Secondary | ICD-10-CM | POA: Diagnosis not present

## 2021-09-29 DIAGNOSIS — F331 Major depressive disorder, recurrent, moderate: Secondary | ICD-10-CM | POA: Diagnosis not present

## 2021-09-30 DIAGNOSIS — S76012D Strain of muscle, fascia and tendon of left hip, subsequent encounter: Secondary | ICD-10-CM | POA: Diagnosis not present

## 2021-09-30 DIAGNOSIS — M25652 Stiffness of left hip, not elsewhere classified: Secondary | ICD-10-CM | POA: Diagnosis not present

## 2021-09-30 DIAGNOSIS — M6281 Muscle weakness (generalized): Secondary | ICD-10-CM | POA: Diagnosis not present

## 2021-10-04 ENCOUNTER — Other Ambulatory Visit: Payer: Self-pay

## 2021-10-04 ENCOUNTER — Ambulatory Visit: Payer: BC Managed Care – PPO | Admitting: Podiatry

## 2021-10-04 ENCOUNTER — Encounter: Payer: Self-pay | Admitting: Podiatry

## 2021-10-04 DIAGNOSIS — G5782 Other specified mononeuropathies of left lower limb: Secondary | ICD-10-CM | POA: Diagnosis not present

## 2021-10-04 NOTE — Progress Notes (Signed)
She presents today for follow-up of her neuroma third interspace of her left foot states that she had a bad flareup about 3 weeks ago and now is better but real tender still.  She states that she is continuing physical therapy on her left hip and now they are starting to concentrate on the foot a little bit as well.  Objective: Vital signs are stable alert orient x3 still has palpable Mulder's click third interspace of the left foot.  Still moderately tender on palpation.  Assessment: Pain in limb secondary to neuroma.  Plan: I injected her eighth dose of dehydrated alcohol left foot third interdigital space today.  We will follow-up with her in 1 month did once again discussed the possible need for surgical intervention.

## 2021-10-05 DIAGNOSIS — S76012D Strain of muscle, fascia and tendon of left hip, subsequent encounter: Secondary | ICD-10-CM | POA: Diagnosis not present

## 2021-10-05 DIAGNOSIS — M6281 Muscle weakness (generalized): Secondary | ICD-10-CM | POA: Diagnosis not present

## 2021-10-05 DIAGNOSIS — M25652 Stiffness of left hip, not elsewhere classified: Secondary | ICD-10-CM | POA: Diagnosis not present

## 2021-10-12 DIAGNOSIS — S76012D Strain of muscle, fascia and tendon of left hip, subsequent encounter: Secondary | ICD-10-CM | POA: Diagnosis not present

## 2021-10-12 DIAGNOSIS — M25652 Stiffness of left hip, not elsewhere classified: Secondary | ICD-10-CM | POA: Diagnosis not present

## 2021-10-12 DIAGNOSIS — M6281 Muscle weakness (generalized): Secondary | ICD-10-CM | POA: Diagnosis not present

## 2021-10-17 DIAGNOSIS — F411 Generalized anxiety disorder: Secondary | ICD-10-CM | POA: Diagnosis not present

## 2021-10-18 DIAGNOSIS — K644 Residual hemorrhoidal skin tags: Secondary | ICD-10-CM | POA: Diagnosis not present

## 2021-10-18 DIAGNOSIS — R1032 Left lower quadrant pain: Secondary | ICD-10-CM | POA: Diagnosis not present

## 2021-10-19 DIAGNOSIS — S76012D Strain of muscle, fascia and tendon of left hip, subsequent encounter: Secondary | ICD-10-CM | POA: Diagnosis not present

## 2021-10-19 DIAGNOSIS — M25652 Stiffness of left hip, not elsewhere classified: Secondary | ICD-10-CM | POA: Diagnosis not present

## 2021-10-19 DIAGNOSIS — M6281 Muscle weakness (generalized): Secondary | ICD-10-CM | POA: Diagnosis not present

## 2021-10-26 DIAGNOSIS — M6281 Muscle weakness (generalized): Secondary | ICD-10-CM | POA: Diagnosis not present

## 2021-10-26 DIAGNOSIS — M25652 Stiffness of left hip, not elsewhere classified: Secondary | ICD-10-CM | POA: Diagnosis not present

## 2021-10-26 DIAGNOSIS — S76012D Strain of muscle, fascia and tendon of left hip, subsequent encounter: Secondary | ICD-10-CM | POA: Diagnosis not present

## 2021-11-01 ENCOUNTER — Ambulatory Visit: Payer: BC Managed Care – PPO | Admitting: Podiatry

## 2021-11-01 DIAGNOSIS — G5782 Other specified mononeuropathies of left lower limb: Secondary | ICD-10-CM | POA: Diagnosis not present

## 2021-11-01 NOTE — Progress Notes (Signed)
She presents today for follow-up of her neuroma third interdigital space of her left foot.  States that it was doing great but now feels that over the last several weeks has just gotten worse again she says it just becomes more more painful as time goes on. ? ?Objective: Vital signs are stable she is alert and oriented x3 reviewed her past medical history medications allergies surgeries and social history.  Denies any trauma.  Pulses remain palpable neurologic concern was intact she still has pain on palpation third interdigital space of the left foot. ? ?Recalcitrant neuroma third interspace left foot. ? ?Plan: We will request MRI for this evaluation of this tumor since appears to be worsening so diagnosis is most likely a neuroma of her recalcitrant in nature.  But we do need to figure out if there are any other problems in this area.  I will follow-up with her in the near future for surgical intervention. ?

## 2021-11-08 ENCOUNTER — Telehealth: Payer: Self-pay | Admitting: *Deleted

## 2021-11-16 DIAGNOSIS — J310 Chronic rhinitis: Secondary | ICD-10-CM | POA: Diagnosis not present

## 2021-11-16 DIAGNOSIS — J342 Deviated nasal septum: Secondary | ICD-10-CM | POA: Diagnosis not present

## 2021-11-16 DIAGNOSIS — J343 Hypertrophy of nasal turbinates: Secondary | ICD-10-CM | POA: Diagnosis not present

## 2021-11-19 ENCOUNTER — Ambulatory Visit
Admission: RE | Admit: 2021-11-19 | Discharge: 2021-11-19 | Disposition: A | Discharge status: Home / Self Care | Payer: BC Managed Care – PPO | Source: Ambulatory Visit | Attending: Podiatry | Admitting: Podiatry

## 2021-11-19 DIAGNOSIS — M79672 Pain in left foot: Secondary | ICD-10-CM | POA: Diagnosis not present

## 2021-11-19 DIAGNOSIS — G5782 Other specified mononeuropathies of left lower limb: Secondary | ICD-10-CM

## 2021-11-19 IMAGING — MR MR FOOT*L* W/O CM
5 series · 40 of 40 positions shown · non-contrast
Comparison: Left foot x-rays dated [DATE]. CT left foot dated
[DATE].

CLINICAL DATA: Chronic dorsal left foot pain. Evaluate for neuroma.
No injury.

EXAM:
MRI OF THE LEFT FOOT WITHOUT CONTRAST
TECHNIQUE: Multiplanar, multisequence MR imaging of the left forefoot was
performed. No intravenous contrast was administered.

[Series 5: T1 · coronal · 3.0mm · 0.38mm/px · 10 of 37 slices shown (1 of 2)]
[im 1/37]
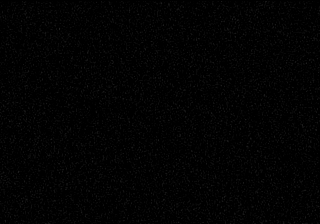
[im 5/37]
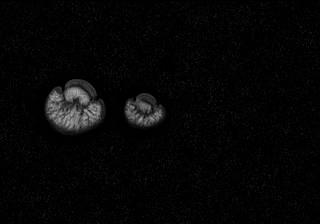
[im 9/37]
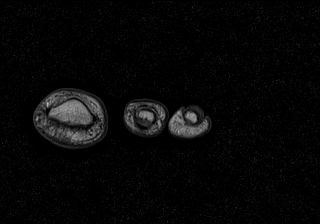
[im 13/37]
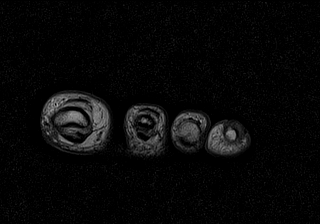
[im 17/37]
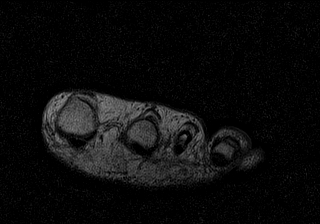
[im 21/37]
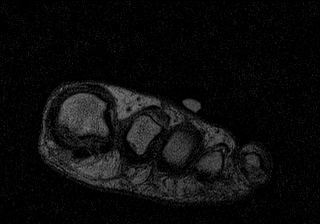
[im 25/37]
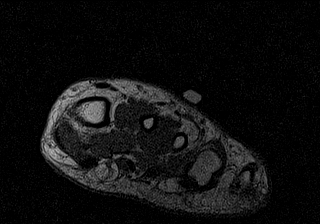
[im 29/37]
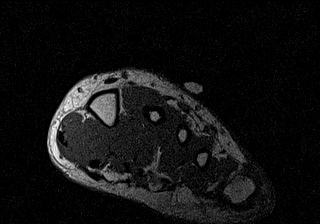
[im 33/37]
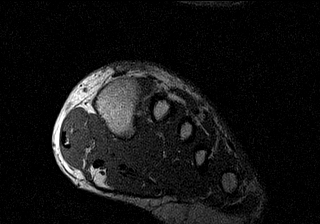
[im 37/37]
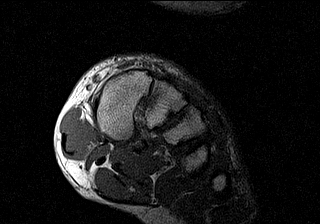

[Series 6: T2 fat-sat · coronal · 3.0mm · 0.38mm/px · 11 of 37 slices shown (1 of 2)]
[im 1/37]
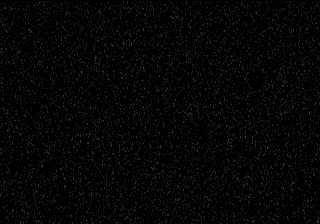
[im 4/37]
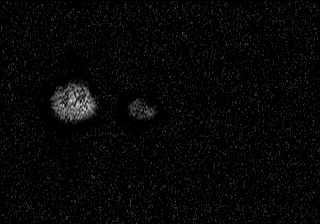
[im 8/37]
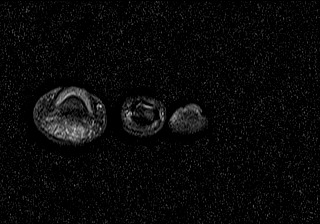
[im 11/37]
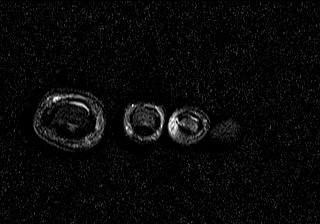
[im 15/37]
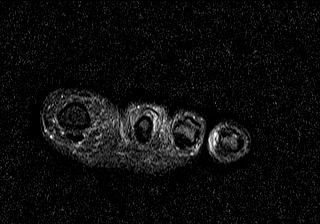
[im 19/37]
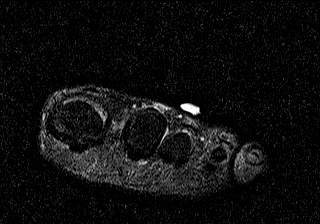
[im 22/37]
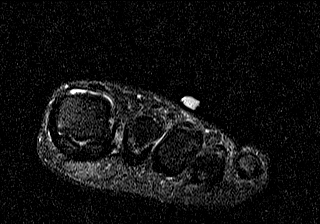
[im 26/37]
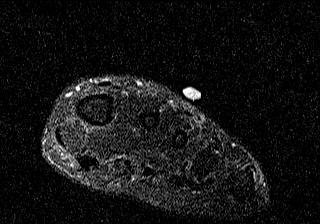
[im 29/37]
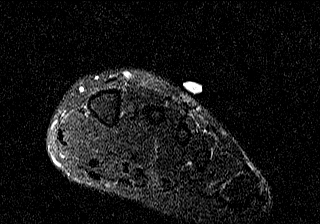
[im 33/37]
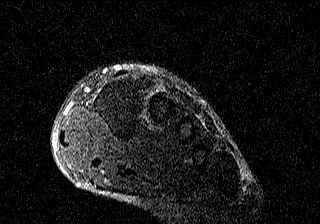
[im 37/37]
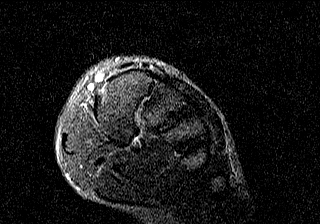

[Series 7: T2 fat-sat · axial · 3.0mm · 0.62mm/px · z∈[-128,-74]mm · 5 of 16 slices shown (2 of 2)]
[im 1/16]
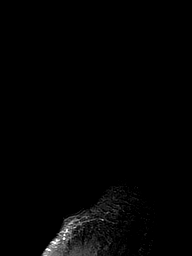
[im 4/16]
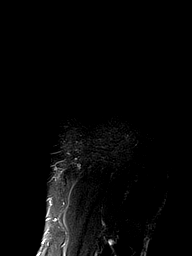
[im 8/16]
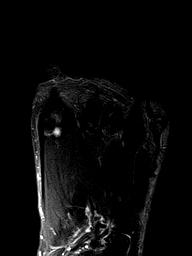
[im 12/16]
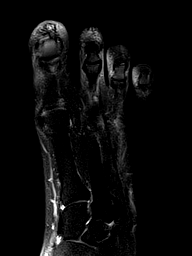
[im 16/16]
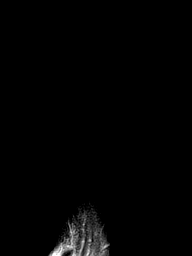

[Series 8: T1 · axial · 3.0mm · 0.42mm/px · z∈[-129,-75]mm · 5 of 16 slices shown (2 of 2)]
[im 1/16]
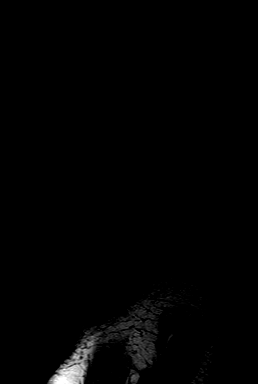
[im 4/16]
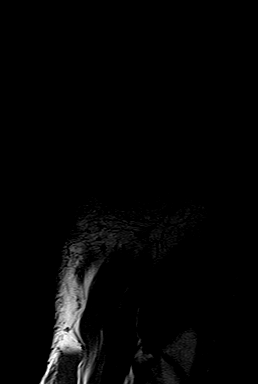
[im 8/16]
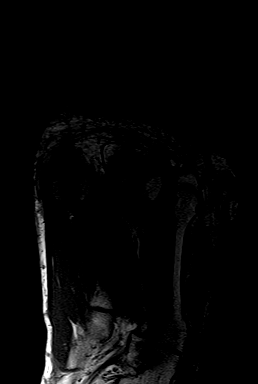
[im 12/16]
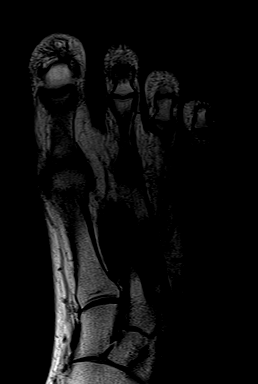
[im 16/16]
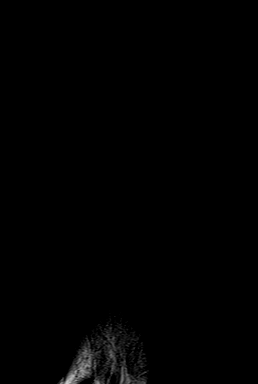

[Series 9: PD fat-sat · sagittal · 3.0mm · 0.41mm/px · 9 of 30 slices shown]
[im 1/30]
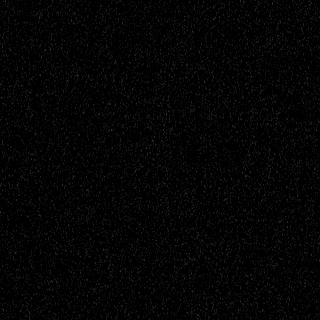
[im 4/30]
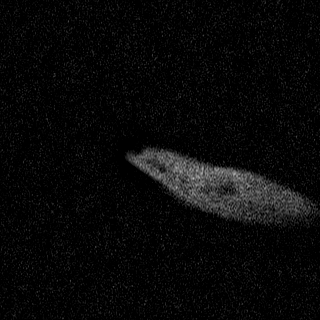
[im 8/30]
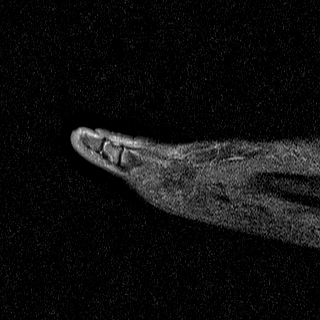
[im 11/30]
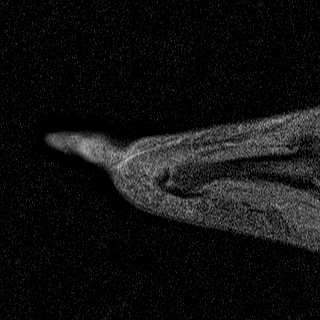
[im 15/30]
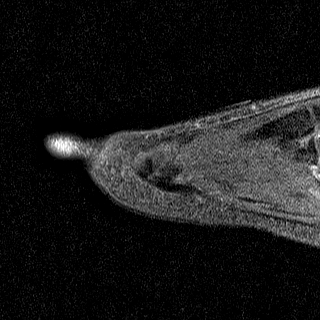
[im 19/30]
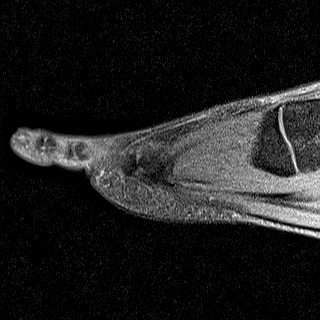
[im 22/30]
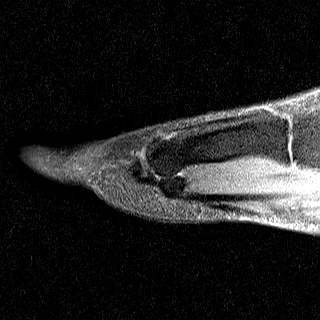
[im 26/30]
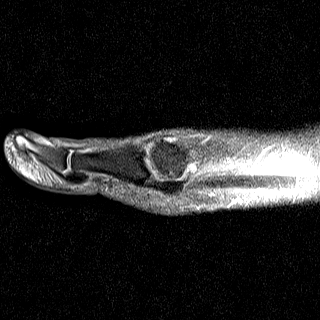
[im 30/30]
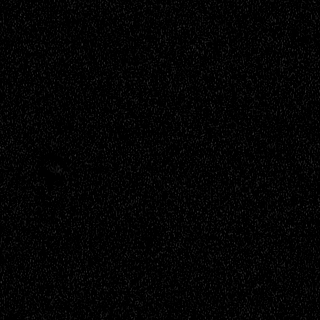

[40 of 40 positions shown; findings below may reference images not displayed]

FINDINGS: Bones/Joint/Cartilage

No marrow signal abnormality. No fracture or dislocation. Joint
spaces are preserved. No joint effusion. Small amount of fluid in
the first intermetatarsal bursa.

Ligaments

Collateral ligaments are intact.  Lisfranc ligament is intact.

Muscles and Tendons
Flexor and extensor tendons are intact. No muscle edema or atrophy.

Soft tissue
No fluid collection or hematoma.  No soft tissue mass.
IMPRESSION: 1. No evidence of neuroma.

## 2021-11-21 ENCOUNTER — Telehealth: Payer: Self-pay | Admitting: Podiatry

## 2021-11-21 NOTE — Telephone Encounter (Signed)
Pt had mri done and I have her scheduled to go over the results on 4.27.2023. I told her we would call if we needed to r/s this appt. ?

## 2021-11-22 NOTE — Telephone Encounter (Signed)
Notified Brandi Foster Dr Al Corpus is wanting to send mri for the 2nd opnion. We have kept her follow up appt on 4.27 and is to call the day before to see if results are in if not we will reschedule Brandi Foster.  ?

## 2021-11-22 NOTE — Telephone Encounter (Signed)
Dr. Al Corpus wants to have her MRI sent out for a 2nd opinion. She will be contacted for an appointment once those results are in.  ? ?Dawn will notified patient and cancel follow up for now. ?

## 2021-11-28 DIAGNOSIS — Z20818 Contact with and (suspected) exposure to other bacterial communicable diseases: Secondary | ICD-10-CM | POA: Diagnosis not present

## 2021-11-28 DIAGNOSIS — F411 Generalized anxiety disorder: Secondary | ICD-10-CM | POA: Diagnosis not present

## 2021-11-28 DIAGNOSIS — J029 Acute pharyngitis, unspecified: Secondary | ICD-10-CM | POA: Diagnosis not present

## 2021-11-28 DIAGNOSIS — F331 Major depressive disorder, recurrent, moderate: Secondary | ICD-10-CM | POA: Diagnosis not present

## 2021-11-28 DIAGNOSIS — F410 Panic disorder [episodic paroxysmal anxiety] without agoraphobia: Secondary | ICD-10-CM | POA: Diagnosis not present

## 2021-11-30 ENCOUNTER — Telehealth: Payer: Self-pay | Admitting: *Deleted

## 2021-11-30 NOTE — Telephone Encounter (Signed)
Patient has been notified and rescheduled for 12/15/21 with Dr. Milinda Pointer ?

## 2021-11-30 NOTE — Telephone Encounter (Signed)
Patient is calling to make sure her MRI over read is in before coming in for her appointment on tomorrow morning. Please advise. ?

## 2021-11-30 NOTE — Telephone Encounter (Addendum)
Patient is calling to see if the over read MRI has come in before he upcoming appointment on the 27 th. Please call, will be unavailable from 11:30-1:30-in a meeting. ?

## 2021-12-01 ENCOUNTER — Ambulatory Visit: Payer: BC Managed Care – PPO | Admitting: Podiatry

## 2021-12-06 ENCOUNTER — Encounter: Payer: Self-pay | Admitting: *Deleted

## 2021-12-06 NOTE — Telephone Encounter (Signed)
error 

## 2021-12-13 DIAGNOSIS — Z124 Encounter for screening for malignant neoplasm of cervix: Secondary | ICD-10-CM | POA: Diagnosis not present

## 2021-12-13 DIAGNOSIS — Z6841 Body Mass Index (BMI) 40.0 and over, adult: Secondary | ICD-10-CM | POA: Diagnosis not present

## 2021-12-13 DIAGNOSIS — Z1231 Encounter for screening mammogram for malignant neoplasm of breast: Secondary | ICD-10-CM | POA: Diagnosis not present

## 2021-12-13 DIAGNOSIS — Z01419 Encounter for gynecological examination (general) (routine) without abnormal findings: Secondary | ICD-10-CM | POA: Diagnosis not present

## 2021-12-14 ENCOUNTER — Telehealth: Payer: Self-pay | Admitting: *Deleted

## 2021-12-15 ENCOUNTER — Ambulatory Visit: Payer: BC Managed Care – PPO | Admitting: Podiatry

## 2021-12-15 ENCOUNTER — Encounter: Payer: Self-pay | Admitting: Podiatry

## 2021-12-15 DIAGNOSIS — G5782 Other specified mononeuropathies of left lower limb: Secondary | ICD-10-CM

## 2021-12-18 NOTE — Progress Notes (Signed)
She presents today states that there is been absolutely no change in pain to her left foot. ? ?Objective: Vital signs are stable she is alert and oriented x3 still has severe pain on palpation to the third interdigital space of the left foot with a palpable Mulder's click.  There is obviously a palpable mass between these metatarsals and toes.  MRI demonstrates only a bursitis but no neuroma.  However findings do demonstrate neural and neurological type symptoms consistent with either pressure on the nerve or the neuroma itself. ? ?Assessment: Neuroma bursitis third interdigital space left foot. ? ?Plan: At this point were going to perform a consent today consisting of a neurectomy to the third interdigital space of the left foot.  She understands that it may be something as simple as removing a bursa.  Either way we will decompress the intermetatarsal space and the interdigital space.  We did discuss the possible postop complications which may include but not limited to postop pain bleeding swelling infection recurrence need for further surgery overcorrection under correction also digit loss of limb loss of life.  Signed all 3 pages of consent form provided her with materials that she needs for surgery we will follow-up with her in the near future. ?

## 2021-12-19 ENCOUNTER — Encounter: Payer: Self-pay | Admitting: Podiatry

## 2021-12-22 ENCOUNTER — Telehealth: Payer: Self-pay | Admitting: Urology

## 2021-12-22 NOTE — Telephone Encounter (Signed)
DOS -01/20/22  NEURECTOMY 3RD LEFT ---28080  PLAN DEDUCTIBLE - $1,000.00 W/ $167.52 REMAINING OUT OF POCKET - $3,000.00 W/ [id number] REMAINING COINSURANCE - 20% COPAY - $0.00  NO PRIOR AUTH REQUIRED

## 2021-12-26 DIAGNOSIS — F411 Generalized anxiety disorder: Secondary | ICD-10-CM | POA: Diagnosis not present

## 2021-12-26 DIAGNOSIS — F410 Panic disorder [episodic paroxysmal anxiety] without agoraphobia: Secondary | ICD-10-CM | POA: Diagnosis not present

## 2021-12-26 DIAGNOSIS — F331 Major depressive disorder, recurrent, moderate: Secondary | ICD-10-CM | POA: Diagnosis not present

## 2022-01-04 ENCOUNTER — Telehealth: Payer: Self-pay

## 2022-01-04 DIAGNOSIS — M546 Pain in thoracic spine: Secondary | ICD-10-CM | POA: Diagnosis not present

## 2022-01-04 DIAGNOSIS — M9901 Segmental and somatic dysfunction of cervical region: Secondary | ICD-10-CM | POA: Diagnosis not present

## 2022-01-04 DIAGNOSIS — M4317 Spondylolisthesis, lumbosacral region: Secondary | ICD-10-CM | POA: Diagnosis not present

## 2022-01-04 DIAGNOSIS — M9906 Segmental and somatic dysfunction of lower extremity: Secondary | ICD-10-CM | POA: Diagnosis not present

## 2022-01-04 DIAGNOSIS — M7742 Metatarsalgia, left foot: Secondary | ICD-10-CM | POA: Diagnosis not present

## 2022-01-04 NOTE — Telephone Encounter (Signed)
[facility] called to cancel her surgery on 01/20/2022 with Dr. Al Corpus. She stated she decided not to have the surgery at this time. She had a second opinion and wants to try other things before having surgery. Notified Dr. Al Corpus and Aram Beecham with [facility].

## 2022-01-06 DIAGNOSIS — M9906 Segmental and somatic dysfunction of lower extremity: Secondary | ICD-10-CM | POA: Diagnosis not present

## 2022-01-06 DIAGNOSIS — M9901 Segmental and somatic dysfunction of cervical region: Secondary | ICD-10-CM | POA: Diagnosis not present

## 2022-01-09 DIAGNOSIS — M9906 Segmental and somatic dysfunction of lower extremity: Secondary | ICD-10-CM | POA: Diagnosis not present

## 2022-01-09 DIAGNOSIS — M9901 Segmental and somatic dysfunction of cervical region: Secondary | ICD-10-CM | POA: Diagnosis not present

## 2022-01-10 DIAGNOSIS — M9901 Segmental and somatic dysfunction of cervical region: Secondary | ICD-10-CM | POA: Diagnosis not present

## 2022-01-10 DIAGNOSIS — M9906 Segmental and somatic dysfunction of lower extremity: Secondary | ICD-10-CM | POA: Diagnosis not present

## 2022-01-11 DIAGNOSIS — M9906 Segmental and somatic dysfunction of lower extremity: Secondary | ICD-10-CM | POA: Diagnosis not present

## 2022-01-11 DIAGNOSIS — M9901 Segmental and somatic dysfunction of cervical region: Secondary | ICD-10-CM | POA: Diagnosis not present

## 2022-01-16 DIAGNOSIS — M9901 Segmental and somatic dysfunction of cervical region: Secondary | ICD-10-CM | POA: Diagnosis not present

## 2022-01-16 DIAGNOSIS — M9906 Segmental and somatic dysfunction of lower extremity: Secondary | ICD-10-CM | POA: Diagnosis not present

## 2022-01-17 DIAGNOSIS — F331 Major depressive disorder, recurrent, moderate: Secondary | ICD-10-CM | POA: Diagnosis not present

## 2022-01-17 DIAGNOSIS — F410 Panic disorder [episodic paroxysmal anxiety] without agoraphobia: Secondary | ICD-10-CM | POA: Diagnosis not present

## 2022-01-17 DIAGNOSIS — F411 Generalized anxiety disorder: Secondary | ICD-10-CM | POA: Diagnosis not present

## 2022-01-18 DIAGNOSIS — M9901 Segmental and somatic dysfunction of cervical region: Secondary | ICD-10-CM | POA: Diagnosis not present

## 2022-01-18 DIAGNOSIS — N3000 Acute cystitis without hematuria: Secondary | ICD-10-CM | POA: Diagnosis not present

## 2022-01-18 DIAGNOSIS — R300 Dysuria: Secondary | ICD-10-CM | POA: Diagnosis not present

## 2022-01-18 DIAGNOSIS — M9906 Segmental and somatic dysfunction of lower extremity: Secondary | ICD-10-CM | POA: Diagnosis not present

## 2022-01-19 DIAGNOSIS — M9906 Segmental and somatic dysfunction of lower extremity: Secondary | ICD-10-CM | POA: Diagnosis not present

## 2022-01-19 DIAGNOSIS — M9901 Segmental and somatic dysfunction of cervical region: Secondary | ICD-10-CM | POA: Diagnosis not present

## 2022-01-24 DIAGNOSIS — M9901 Segmental and somatic dysfunction of cervical region: Secondary | ICD-10-CM | POA: Diagnosis not present

## 2022-01-24 DIAGNOSIS — M9906 Segmental and somatic dysfunction of lower extremity: Secondary | ICD-10-CM | POA: Diagnosis not present

## 2022-01-25 DIAGNOSIS — F411 Generalized anxiety disorder: Secondary | ICD-10-CM | POA: Diagnosis not present

## 2022-01-25 DIAGNOSIS — F331 Major depressive disorder, recurrent, moderate: Secondary | ICD-10-CM | POA: Diagnosis not present

## 2022-01-26 ENCOUNTER — Encounter: Payer: BC Managed Care – PPO | Admitting: Podiatry

## 2022-01-26 DIAGNOSIS — M9906 Segmental and somatic dysfunction of lower extremity: Secondary | ICD-10-CM | POA: Diagnosis not present

## 2022-01-26 DIAGNOSIS — M9901 Segmental and somatic dysfunction of cervical region: Secondary | ICD-10-CM | POA: Diagnosis not present

## 2022-01-27 DIAGNOSIS — M9906 Segmental and somatic dysfunction of lower extremity: Secondary | ICD-10-CM | POA: Diagnosis not present

## 2022-01-27 DIAGNOSIS — M9901 Segmental and somatic dysfunction of cervical region: Secondary | ICD-10-CM | POA: Diagnosis not present

## 2022-01-31 DIAGNOSIS — M4317 Spondylolisthesis, lumbosacral region: Secondary | ICD-10-CM | POA: Diagnosis not present

## 2022-01-31 DIAGNOSIS — M9906 Segmental and somatic dysfunction of lower extremity: Secondary | ICD-10-CM | POA: Diagnosis not present

## 2022-01-31 DIAGNOSIS — M9901 Segmental and somatic dysfunction of cervical region: Secondary | ICD-10-CM | POA: Diagnosis not present

## 2022-02-01 DIAGNOSIS — M7742 Metatarsalgia, left foot: Secondary | ICD-10-CM | POA: Diagnosis not present

## 2022-02-02 ENCOUNTER — Encounter: Payer: BC Managed Care – PPO | Admitting: Podiatry

## 2022-02-02 DIAGNOSIS — M9901 Segmental and somatic dysfunction of cervical region: Secondary | ICD-10-CM | POA: Diagnosis not present

## 2022-02-02 DIAGNOSIS — M9906 Segmental and somatic dysfunction of lower extremity: Secondary | ICD-10-CM | POA: Diagnosis not present

## 2022-02-09 DIAGNOSIS — F411 Generalized anxiety disorder: Secondary | ICD-10-CM | POA: Diagnosis not present

## 2022-02-09 DIAGNOSIS — M9901 Segmental and somatic dysfunction of cervical region: Secondary | ICD-10-CM | POA: Diagnosis not present

## 2022-02-09 DIAGNOSIS — F331 Major depressive disorder, recurrent, moderate: Secondary | ICD-10-CM | POA: Diagnosis not present

## 2022-02-09 DIAGNOSIS — M9906 Segmental and somatic dysfunction of lower extremity: Secondary | ICD-10-CM | POA: Diagnosis not present

## 2022-02-09 DIAGNOSIS — F410 Panic disorder [episodic paroxysmal anxiety] without agoraphobia: Secondary | ICD-10-CM | POA: Diagnosis not present

## 2022-02-10 DIAGNOSIS — M9906 Segmental and somatic dysfunction of lower extremity: Secondary | ICD-10-CM | POA: Diagnosis not present

## 2022-02-10 DIAGNOSIS — M9901 Segmental and somatic dysfunction of cervical region: Secondary | ICD-10-CM | POA: Diagnosis not present

## 2022-02-14 DIAGNOSIS — M4317 Spondylolisthesis, lumbosacral region: Secondary | ICD-10-CM | POA: Diagnosis not present

## 2022-02-14 DIAGNOSIS — M9902 Segmental and somatic dysfunction of thoracic region: Secondary | ICD-10-CM | POA: Diagnosis not present

## 2022-02-14 DIAGNOSIS — M9906 Segmental and somatic dysfunction of lower extremity: Secondary | ICD-10-CM | POA: Diagnosis not present

## 2022-02-14 DIAGNOSIS — M9903 Segmental and somatic dysfunction of lumbar region: Secondary | ICD-10-CM | POA: Diagnosis not present

## 2022-02-14 DIAGNOSIS — M9901 Segmental and somatic dysfunction of cervical region: Secondary | ICD-10-CM | POA: Diagnosis not present

## 2022-02-16 DIAGNOSIS — M9906 Segmental and somatic dysfunction of lower extremity: Secondary | ICD-10-CM | POA: Diagnosis not present

## 2022-02-16 DIAGNOSIS — M9901 Segmental and somatic dysfunction of cervical region: Secondary | ICD-10-CM | POA: Diagnosis not present

## 2022-02-20 DIAGNOSIS — F411 Generalized anxiety disorder: Secondary | ICD-10-CM | POA: Diagnosis not present

## 2022-02-20 DIAGNOSIS — F410 Panic disorder [episodic paroxysmal anxiety] without agoraphobia: Secondary | ICD-10-CM | POA: Diagnosis not present

## 2022-02-20 DIAGNOSIS — F331 Major depressive disorder, recurrent, moderate: Secondary | ICD-10-CM | POA: Diagnosis not present

## 2022-02-21 ENCOUNTER — Encounter: Payer: BC Managed Care – PPO | Admitting: Podiatry

## 2022-02-21 DIAGNOSIS — M9901 Segmental and somatic dysfunction of cervical region: Secondary | ICD-10-CM | POA: Diagnosis not present

## 2022-02-21 DIAGNOSIS — M9906 Segmental and somatic dysfunction of lower extremity: Secondary | ICD-10-CM | POA: Diagnosis not present

## 2022-02-27 DIAGNOSIS — N393 Stress incontinence (female) (male): Secondary | ICD-10-CM | POA: Diagnosis not present

## 2022-02-27 DIAGNOSIS — M62838 Other muscle spasm: Secondary | ICD-10-CM | POA: Diagnosis not present

## 2022-02-27 DIAGNOSIS — M6281 Muscle weakness (generalized): Secondary | ICD-10-CM | POA: Diagnosis not present

## 2022-02-27 DIAGNOSIS — M6289 Other specified disorders of muscle: Secondary | ICD-10-CM | POA: Diagnosis not present

## 2022-03-02 DIAGNOSIS — F410 Panic disorder [episodic paroxysmal anxiety] without agoraphobia: Secondary | ICD-10-CM | POA: Diagnosis not present

## 2022-03-02 DIAGNOSIS — F331 Major depressive disorder, recurrent, moderate: Secondary | ICD-10-CM | POA: Diagnosis not present

## 2022-03-02 DIAGNOSIS — F411 Generalized anxiety disorder: Secondary | ICD-10-CM | POA: Diagnosis not present

## 2022-03-07 ENCOUNTER — Encounter: Payer: BC Managed Care – PPO | Admitting: Podiatry

## 2022-03-27 DIAGNOSIS — N393 Stress incontinence (female) (male): Secondary | ICD-10-CM | POA: Diagnosis not present

## 2022-03-27 DIAGNOSIS — M6289 Other specified disorders of muscle: Secondary | ICD-10-CM | POA: Diagnosis not present

## 2022-03-27 DIAGNOSIS — M62838 Other muscle spasm: Secondary | ICD-10-CM | POA: Diagnosis not present

## 2022-03-27 DIAGNOSIS — M6281 Muscle weakness (generalized): Secondary | ICD-10-CM | POA: Diagnosis not present

## 2022-04-05 NOTE — Telephone Encounter (Signed)
error 

## 2022-04-07 DIAGNOSIS — M62838 Other muscle spasm: Secondary | ICD-10-CM | POA: Diagnosis not present

## 2022-04-07 DIAGNOSIS — M6289 Other specified disorders of muscle: Secondary | ICD-10-CM | POA: Diagnosis not present

## 2022-04-07 DIAGNOSIS — M6281 Muscle weakness (generalized): Secondary | ICD-10-CM | POA: Diagnosis not present

## 2022-04-07 DIAGNOSIS — N393 Stress incontinence (female) (male): Secondary | ICD-10-CM | POA: Diagnosis not present

## 2022-04-21 DIAGNOSIS — H35363 Drusen (degenerative) of macula, bilateral: Secondary | ICD-10-CM | POA: Diagnosis not present

## 2022-04-21 DIAGNOSIS — H3581 Retinal edema: Secondary | ICD-10-CM | POA: Diagnosis not present

## 2022-04-21 DIAGNOSIS — H2513 Age-related nuclear cataract, bilateral: Secondary | ICD-10-CM | POA: Diagnosis not present

## 2022-05-09 DIAGNOSIS — F410 Panic disorder [episodic paroxysmal anxiety] without agoraphobia: Secondary | ICD-10-CM | POA: Diagnosis not present

## 2022-05-09 DIAGNOSIS — F411 Generalized anxiety disorder: Secondary | ICD-10-CM | POA: Diagnosis not present

## 2022-05-09 DIAGNOSIS — F331 Major depressive disorder, recurrent, moderate: Secondary | ICD-10-CM | POA: Diagnosis not present

## 2022-05-17 DIAGNOSIS — J343 Hypertrophy of nasal turbinates: Secondary | ICD-10-CM | POA: Diagnosis not present

## 2022-05-17 DIAGNOSIS — J310 Chronic rhinitis: Secondary | ICD-10-CM | POA: Diagnosis not present

## 2022-05-17 DIAGNOSIS — J342 Deviated nasal septum: Secondary | ICD-10-CM | POA: Diagnosis not present

## 2022-05-18 DIAGNOSIS — H3581 Retinal edema: Secondary | ICD-10-CM | POA: Diagnosis not present

## 2022-05-18 DIAGNOSIS — H2513 Age-related nuclear cataract, bilateral: Secondary | ICD-10-CM | POA: Diagnosis not present

## 2022-05-18 DIAGNOSIS — H35363 Drusen (degenerative) of macula, bilateral: Secondary | ICD-10-CM | POA: Diagnosis not present

## 2022-06-05 DIAGNOSIS — F331 Major depressive disorder, recurrent, moderate: Secondary | ICD-10-CM | POA: Diagnosis not present

## 2022-06-05 DIAGNOSIS — F411 Generalized anxiety disorder: Secondary | ICD-10-CM | POA: Diagnosis not present

## 2022-06-05 DIAGNOSIS — F410 Panic disorder [episodic paroxysmal anxiety] without agoraphobia: Secondary | ICD-10-CM | POA: Diagnosis not present

## 2022-06-20 DIAGNOSIS — Z13220 Encounter for screening for lipoid disorders: Secondary | ICD-10-CM | POA: Diagnosis not present

## 2022-06-20 DIAGNOSIS — F411 Generalized anxiety disorder: Secondary | ICD-10-CM | POA: Diagnosis not present

## 2022-06-20 DIAGNOSIS — Z23 Encounter for immunization: Secondary | ICD-10-CM | POA: Diagnosis not present

## 2022-06-20 DIAGNOSIS — Z0000 Encounter for general adult medical examination without abnormal findings: Secondary | ICD-10-CM | POA: Diagnosis not present

## 2022-09-07 DIAGNOSIS — F410 Panic disorder [episodic paroxysmal anxiety] without agoraphobia: Secondary | ICD-10-CM | POA: Diagnosis not present

## 2022-09-07 DIAGNOSIS — F411 Generalized anxiety disorder: Secondary | ICD-10-CM | POA: Diagnosis not present

## 2022-09-07 DIAGNOSIS — F331 Major depressive disorder, recurrent, moderate: Secondary | ICD-10-CM | POA: Diagnosis not present

## 2022-09-21 DIAGNOSIS — F411 Generalized anxiety disorder: Secondary | ICD-10-CM | POA: Diagnosis not present

## 2022-10-10 DIAGNOSIS — F411 Generalized anxiety disorder: Secondary | ICD-10-CM | POA: Diagnosis not present

## 2022-10-10 DIAGNOSIS — F331 Major depressive disorder, recurrent, moderate: Secondary | ICD-10-CM | POA: Diagnosis not present

## 2022-10-10 DIAGNOSIS — F410 Panic disorder [episodic paroxysmal anxiety] without agoraphobia: Secondary | ICD-10-CM | POA: Diagnosis not present

## 2022-11-15 DIAGNOSIS — J343 Hypertrophy of nasal turbinates: Secondary | ICD-10-CM | POA: Diagnosis not present

## 2022-11-15 DIAGNOSIS — J310 Chronic rhinitis: Secondary | ICD-10-CM | POA: Diagnosis not present

## 2022-11-15 DIAGNOSIS — J342 Deviated nasal septum: Secondary | ICD-10-CM | POA: Diagnosis not present

## 2022-12-11 DIAGNOSIS — R1011 Right upper quadrant pain: Secondary | ICD-10-CM | POA: Diagnosis not present

## 2022-12-20 DIAGNOSIS — F411 Generalized anxiety disorder: Secondary | ICD-10-CM | POA: Diagnosis not present

## 2022-12-20 DIAGNOSIS — R109 Unspecified abdominal pain: Secondary | ICD-10-CM | POA: Diagnosis not present

## 2022-12-21 DIAGNOSIS — L814 Other melanin hyperpigmentation: Secondary | ICD-10-CM | POA: Diagnosis not present

## 2022-12-21 DIAGNOSIS — L821 Other seborrheic keratosis: Secondary | ICD-10-CM | POA: Diagnosis not present

## 2022-12-21 DIAGNOSIS — Z6841 Body Mass Index (BMI) 40.0 and over, adult: Secondary | ICD-10-CM | POA: Diagnosis not present

## 2022-12-21 DIAGNOSIS — L668 Other cicatricial alopecia: Secondary | ICD-10-CM | POA: Diagnosis not present

## 2022-12-21 DIAGNOSIS — Z01419 Encounter for gynecological examination (general) (routine) without abnormal findings: Secondary | ICD-10-CM | POA: Diagnosis not present

## 2022-12-21 DIAGNOSIS — Z1231 Encounter for screening mammogram for malignant neoplasm of breast: Secondary | ICD-10-CM | POA: Diagnosis not present

## 2022-12-21 DIAGNOSIS — L661 Lichen planopilaris: Secondary | ICD-10-CM | POA: Diagnosis not present

## 2022-12-28 DIAGNOSIS — F410 Panic disorder [episodic paroxysmal anxiety] without agoraphobia: Secondary | ICD-10-CM | POA: Diagnosis not present

## 2022-12-28 DIAGNOSIS — F411 Generalized anxiety disorder: Secondary | ICD-10-CM | POA: Diagnosis not present

## 2022-12-28 DIAGNOSIS — F331 Major depressive disorder, recurrent, moderate: Secondary | ICD-10-CM | POA: Diagnosis not present

## 2023-01-11 DIAGNOSIS — F410 Panic disorder [episodic paroxysmal anxiety] without agoraphobia: Secondary | ICD-10-CM | POA: Diagnosis not present

## 2023-01-11 DIAGNOSIS — F411 Generalized anxiety disorder: Secondary | ICD-10-CM | POA: Diagnosis not present

## 2023-01-11 DIAGNOSIS — F331 Major depressive disorder, recurrent, moderate: Secondary | ICD-10-CM | POA: Diagnosis not present

## 2023-01-30 DIAGNOSIS — S8011XA Contusion of right lower leg, initial encounter: Secondary | ICD-10-CM | POA: Diagnosis not present

## 2023-01-30 DIAGNOSIS — W108XXA Fall (on) (from) other stairs and steps, initial encounter: Secondary | ICD-10-CM | POA: Diagnosis not present

## 2023-01-30 DIAGNOSIS — F411 Generalized anxiety disorder: Secondary | ICD-10-CM | POA: Diagnosis not present

## 2023-02-07 DIAGNOSIS — F410 Panic disorder [episodic paroxysmal anxiety] without agoraphobia: Secondary | ICD-10-CM | POA: Diagnosis not present

## 2023-02-07 DIAGNOSIS — F411 Generalized anxiety disorder: Secondary | ICD-10-CM | POA: Diagnosis not present

## 2023-02-07 DIAGNOSIS — F331 Major depressive disorder, recurrent, moderate: Secondary | ICD-10-CM | POA: Diagnosis not present

## 2023-03-12 DIAGNOSIS — M6281 Muscle weakness (generalized): Secondary | ICD-10-CM | POA: Diagnosis not present

## 2023-03-12 DIAGNOSIS — N393 Stress incontinence (female) (male): Secondary | ICD-10-CM | POA: Diagnosis not present

## 2023-04-03 DIAGNOSIS — M79671 Pain in right foot: Secondary | ICD-10-CM | POA: Diagnosis not present

## 2023-04-17 DIAGNOSIS — M79671 Pain in right foot: Secondary | ICD-10-CM | POA: Diagnosis not present

## 2023-04-23 DIAGNOSIS — F410 Panic disorder [episodic paroxysmal anxiety] without agoraphobia: Secondary | ICD-10-CM | POA: Diagnosis not present

## 2023-04-23 DIAGNOSIS — F411 Generalized anxiety disorder: Secondary | ICD-10-CM | POA: Diagnosis not present

## 2023-04-23 DIAGNOSIS — F331 Major depressive disorder, recurrent, moderate: Secondary | ICD-10-CM | POA: Diagnosis not present

## 2023-05-07 DIAGNOSIS — F331 Major depressive disorder, recurrent, moderate: Secondary | ICD-10-CM | POA: Diagnosis not present

## 2023-05-07 DIAGNOSIS — F411 Generalized anxiety disorder: Secondary | ICD-10-CM | POA: Diagnosis not present

## 2023-05-07 DIAGNOSIS — F410 Panic disorder [episodic paroxysmal anxiety] without agoraphobia: Secondary | ICD-10-CM | POA: Diagnosis not present

## 2023-05-09 ENCOUNTER — Other Ambulatory Visit: Payer: Self-pay | Admitting: Family Medicine

## 2023-05-09 DIAGNOSIS — M79671 Pain in right foot: Secondary | ICD-10-CM

## 2023-05-14 ENCOUNTER — Ambulatory Visit
Admission: RE | Admit: 2023-05-14 | Discharge: 2023-05-14 | Disposition: A | Discharge status: Home / Self Care | Payer: BC Managed Care – PPO | Source: Ambulatory Visit | Attending: Family Medicine

## 2023-05-14 DIAGNOSIS — M79671 Pain in right foot: Secondary | ICD-10-CM

## 2023-05-23 DIAGNOSIS — L708 Other acne: Secondary | ICD-10-CM | POA: Diagnosis not present

## 2023-05-23 DIAGNOSIS — L6611 Classic lichen planopilaris: Secondary | ICD-10-CM | POA: Diagnosis not present

## 2023-05-23 DIAGNOSIS — L6612 Frontal fibrosing alopecia: Secondary | ICD-10-CM | POA: Diagnosis not present

## 2023-05-25 DIAGNOSIS — N393 Stress incontinence (female) (male): Secondary | ICD-10-CM | POA: Diagnosis not present

## 2023-05-25 DIAGNOSIS — M6289 Other specified disorders of muscle: Secondary | ICD-10-CM | POA: Diagnosis not present

## 2023-05-25 DIAGNOSIS — M6281 Muscle weakness (generalized): Secondary | ICD-10-CM | POA: Diagnosis not present

## 2023-05-25 DIAGNOSIS — M62838 Other muscle spasm: Secondary | ICD-10-CM | POA: Diagnosis not present

## 2023-06-11 DIAGNOSIS — M6281 Muscle weakness (generalized): Secondary | ICD-10-CM | POA: Diagnosis not present

## 2023-06-11 DIAGNOSIS — N393 Stress incontinence (female) (male): Secondary | ICD-10-CM | POA: Diagnosis not present

## 2023-06-11 DIAGNOSIS — M6289 Other specified disorders of muscle: Secondary | ICD-10-CM | POA: Diagnosis not present

## 2023-06-11 DIAGNOSIS — M62838 Other muscle spasm: Secondary | ICD-10-CM | POA: Diagnosis not present

## 2023-06-14 DIAGNOSIS — F411 Generalized anxiety disorder: Secondary | ICD-10-CM | POA: Diagnosis not present

## 2023-06-14 DIAGNOSIS — F410 Panic disorder [episodic paroxysmal anxiety] without agoraphobia: Secondary | ICD-10-CM | POA: Diagnosis not present

## 2023-06-14 DIAGNOSIS — F331 Major depressive disorder, recurrent, moderate: Secondary | ICD-10-CM | POA: Diagnosis not present

## 2023-07-20 DIAGNOSIS — Z13220 Encounter for screening for lipoid disorders: Secondary | ICD-10-CM | POA: Diagnosis not present

## 2023-07-20 DIAGNOSIS — Z0000 Encounter for general adult medical examination without abnormal findings: Secondary | ICD-10-CM | POA: Diagnosis not present

## 2023-07-20 DIAGNOSIS — F411 Generalized anxiety disorder: Secondary | ICD-10-CM | POA: Diagnosis not present

## 2023-07-23 DIAGNOSIS — N393 Stress incontinence (female) (male): Secondary | ICD-10-CM | POA: Diagnosis not present

## 2023-07-23 DIAGNOSIS — M6289 Other specified disorders of muscle: Secondary | ICD-10-CM | POA: Diagnosis not present

## 2023-07-23 DIAGNOSIS — M62838 Other muscle spasm: Secondary | ICD-10-CM | POA: Diagnosis not present

## 2023-07-23 DIAGNOSIS — M6281 Muscle weakness (generalized): Secondary | ICD-10-CM | POA: Diagnosis not present

## 2023-08-20 DIAGNOSIS — H2513 Age-related nuclear cataract, bilateral: Secondary | ICD-10-CM | POA: Diagnosis not present

## 2023-08-20 DIAGNOSIS — H35362 Drusen (degenerative) of macula, left eye: Secondary | ICD-10-CM | POA: Diagnosis not present

## 2023-08-20 DIAGNOSIS — H35363 Drusen (degenerative) of macula, bilateral: Secondary | ICD-10-CM | POA: Diagnosis not present

## 2023-08-20 DIAGNOSIS — H3581 Retinal edema: Secondary | ICD-10-CM | POA: Diagnosis not present

## 2023-08-20 DIAGNOSIS — H35361 Drusen (degenerative) of macula, right eye: Secondary | ICD-10-CM | POA: Diagnosis not present

## 2023-08-28 DIAGNOSIS — Z6841 Body Mass Index (BMI) 40.0 and over, adult: Secondary | ICD-10-CM | POA: Diagnosis not present

## 2023-08-29 DIAGNOSIS — M6281 Muscle weakness (generalized): Secondary | ICD-10-CM | POA: Diagnosis not present

## 2023-08-29 DIAGNOSIS — M62838 Other muscle spasm: Secondary | ICD-10-CM | POA: Diagnosis not present

## 2023-08-29 DIAGNOSIS — N393 Stress incontinence (female) (male): Secondary | ICD-10-CM | POA: Diagnosis not present

## 2023-08-29 DIAGNOSIS — M6289 Other specified disorders of muscle: Secondary | ICD-10-CM | POA: Diagnosis not present

## 2023-09-04 DIAGNOSIS — D1801 Hemangioma of skin and subcutaneous tissue: Secondary | ICD-10-CM | POA: Diagnosis not present

## 2023-09-26 DIAGNOSIS — M6281 Muscle weakness (generalized): Secondary | ICD-10-CM | POA: Diagnosis not present

## 2023-09-26 DIAGNOSIS — N393 Stress incontinence (female) (male): Secondary | ICD-10-CM | POA: Diagnosis not present

## 2023-09-26 DIAGNOSIS — M6289 Other specified disorders of muscle: Secondary | ICD-10-CM | POA: Diagnosis not present

## 2023-09-26 DIAGNOSIS — M62838 Other muscle spasm: Secondary | ICD-10-CM | POA: Diagnosis not present

## 2023-10-03 DIAGNOSIS — Z6841 Body Mass Index (BMI) 40.0 and over, adult: Secondary | ICD-10-CM | POA: Diagnosis not present

## 2023-10-11 DIAGNOSIS — N393 Stress incontinence (female) (male): Secondary | ICD-10-CM | POA: Diagnosis not present

## 2023-10-11 DIAGNOSIS — M62838 Other muscle spasm: Secondary | ICD-10-CM | POA: Diagnosis not present

## 2023-10-11 DIAGNOSIS — M6289 Other specified disorders of muscle: Secondary | ICD-10-CM | POA: Diagnosis not present

## 2023-10-11 DIAGNOSIS — M6281 Muscle weakness (generalized): Secondary | ICD-10-CM | POA: Diagnosis not present

## 2023-11-01 DIAGNOSIS — M6281 Muscle weakness (generalized): Secondary | ICD-10-CM | POA: Diagnosis not present

## 2023-11-01 DIAGNOSIS — M6289 Other specified disorders of muscle: Secondary | ICD-10-CM | POA: Diagnosis not present

## 2023-11-01 DIAGNOSIS — M62838 Other muscle spasm: Secondary | ICD-10-CM | POA: Diagnosis not present

## 2023-11-01 DIAGNOSIS — N393 Stress incontinence (female) (male): Secondary | ICD-10-CM | POA: Diagnosis not present

## 2023-11-29 DIAGNOSIS — N393 Stress incontinence (female) (male): Secondary | ICD-10-CM | POA: Diagnosis not present

## 2023-11-29 DIAGNOSIS — R8271 Bacteriuria: Secondary | ICD-10-CM | POA: Diagnosis not present

## 2023-11-29 DIAGNOSIS — R350 Frequency of micturition: Secondary | ICD-10-CM | POA: Diagnosis not present

## 2023-11-29 DIAGNOSIS — R351 Nocturia: Secondary | ICD-10-CM | POA: Diagnosis not present

## 2023-12-11 ENCOUNTER — Ambulatory Visit (INDEPENDENT_AMBULATORY_CARE_PROVIDER_SITE_OTHER): Payer: BC Managed Care – PPO | Admitting: Otolaryngology

## 2023-12-11 VITALS — HR 70 | Ht 63.0 in | Wt 250.0 lb

## 2023-12-11 DIAGNOSIS — J343 Hypertrophy of nasal turbinates: Secondary | ICD-10-CM

## 2023-12-11 DIAGNOSIS — R350 Frequency of micturition: Secondary | ICD-10-CM | POA: Diagnosis not present

## 2023-12-11 DIAGNOSIS — J323 Chronic sphenoidal sinusitis: Secondary | ICD-10-CM

## 2023-12-11 DIAGNOSIS — J342 Deviated nasal septum: Secondary | ICD-10-CM

## 2023-12-11 DIAGNOSIS — R0981 Nasal congestion: Secondary | ICD-10-CM | POA: Diagnosis not present

## 2023-12-11 DIAGNOSIS — J310 Chronic rhinitis: Secondary | ICD-10-CM

## 2023-12-11 NOTE — Progress Notes (Unsigned)
 Patient ID: Brandi Foster, female   DOB: 1981/04/24, 43 y.o.   MRN: [id number]  Follow-up: Chronic sinusitis, chronic nasal obstruction  HPI: The patient is a 43 year old female who returns today for her follow-up evaluation.  The patient has a history of chronic sphenoid sinusitis and chronic nasal obstruction.  At her last visit in April 2024, she was noted to have nasal septal deviation and bilateral inferior turbinate hypertrophy.  She was treated with Flonase  nasal spray and nasal saline irrigation.  The patient returns today reporting occasional nasal congestion and left-sided facial pressure.  She denies any recent sinusitis.  She also denies any fever or visual change.  Her nasal breathing has improved with the medical treatment.  Exam: General: Communicates without difficulty, well nourished, no acute distress. Head: Normocephalic, no evidence injury, no tenderness, facial buttresses intact without stepoff. Face/sinus: No tenderness to palpation and percussion. Facial movement is normal and symmetric. Eyes: PERRL, EOMI. No scleral icterus, conjunctivae clear. Neuro: CN II exam reveals vision grossly intact.  No nystagmus at any point of gaze. Ears: Auricles well formed without lesions.  Ear canals are intact without mass or lesion.  No erythema or edema is appreciated.  The TMs are intact without fluid. Nose: External evaluation reveals normal support and skin without lesions.  Dorsum is intact.  Anterior rhinoscopy reveals congested mucosa over anterior aspect of inferior turbinates and deviated septum.  No purulence noted. Oral:  Oral cavity and oropharynx are intact, symmetric, without erythema or edema.  Mucosa is moist without lesions. Neck: Full range of motion without pain.  There is no significant lymphadenopathy.  No masses palpable.  Thyroid  bed within normal limits to palpation.  Parotid glands and submandibular glands equal bilaterally without mass.  Trachea is midline. Neuro:  CN 2-12  grossly intact.   Assessment: 1.  Chronic rhinitis with nasal mucosal congestion, nasal septal deviation, and bilateral inferior turbinate hypertrophy.  The severity of her nasal congestion has decreased. 2.  No polyps or mass is noted on today's examination. 3.  Intermittent left midface pressure.  Plan: 1.  The physical exam findings are reviewed with the patient. 2.  Continue with Flonase  nasal spray and nasal saline irrigation daily. 3.  If her facial pressure persists or worsens, we will proceed with a sinus CT scan to evaluate for possible chronic sinusitis. 4.  The patient will return for reevaluation in 1 year, sooner if needed.

## 2023-12-12 DIAGNOSIS — J323 Chronic sphenoidal sinusitis: Secondary | ICD-10-CM | POA: Insufficient documentation

## 2023-12-12 DIAGNOSIS — J342 Deviated nasal septum: Secondary | ICD-10-CM | POA: Insufficient documentation

## 2023-12-12 DIAGNOSIS — J343 Hypertrophy of nasal turbinates: Secondary | ICD-10-CM | POA: Insufficient documentation

## 2023-12-19 DIAGNOSIS — N393 Stress incontinence (female) (male): Secondary | ICD-10-CM | POA: Diagnosis not present

## 2023-12-19 DIAGNOSIS — R350 Frequency of micturition: Secondary | ICD-10-CM | POA: Diagnosis not present

## 2023-12-20 ENCOUNTER — Other Ambulatory Visit: Payer: Self-pay | Admitting: Urology

## 2023-12-21 ENCOUNTER — Encounter (HOSPITAL_COMMUNITY): Payer: Self-pay | Admitting: *Deleted

## 2023-12-21 NOTE — Progress Notes (Signed)
 Spoke w/ via phone for pre-op interview--- Brandi Foster Lab needs dos----  UPT per anesthesia. Surgeon orders need second sign as of 12/21/23.       Lab results------ COVID test -----patient states asymptomatic no test needed Arrive at -------0745 NPO after MN NO Solid Food.  Pre-Surgery Ensure or G2:  Med rec completed Medications to take morning of surgery -----Zoloft  and Ativan PRN Diabetic medication -----  GLP1 agonist last dose: GLP1 instructions:  Patient instructed no nail polish to be worn day of surgery Patient instructed to bring photo id and insurance card day of surgery Patient aware to have Driver (ride ) / caregiver    for 24 hours after surgery - Husband Brandi Foster Patient Special Instructions ----- Shower with antibacterial soap. Pre-Op special Instructions -----  Patient verbalized understanding of instructions that were given at this phone interview. Patient denies chest pain, sob, fever, cough at the interview.

## 2023-12-24 DIAGNOSIS — Z1231 Encounter for screening mammogram for malignant neoplasm of breast: Secondary | ICD-10-CM | POA: Diagnosis not present

## 2023-12-24 DIAGNOSIS — Z01419 Encounter for gynecological examination (general) (routine) without abnormal findings: Secondary | ICD-10-CM | POA: Diagnosis not present

## 2024-01-14 NOTE — Progress Notes (Signed)
 COVID Vaccine received:  []  No [x]  Yes Date of any COVID positive Test in last 90 days:  PCP - Vyvyan Sun, MD [phone]  Cardiologist - none  Chest x-ray - 2016 2v  Epic EKG -  no history to warrant Stress Test -  ECHO -  Cardiac Cath -  CT Coronary Calcium score:   Bowel Prep - [x]  No  []   Yes ______  Pacemaker / ICD device [x]  No []  Yes   Spinal Cord Stimulator:[x]  No []  Yes       History of Sleep Apnea? []  No []  Yes   CPAP used?- []  No []  Yes    Does the patient monitor blood sugar?   []  N/A   []  No []  Yes  Patient has: []  NO Hx DM   []  Pre-DM   []  DM1  []   DM2 Last A1c was:        on      Does patient have a [employer] or Dexcom? []  No []  Yes   Fasting Blood Sugar Ranges-  Checks Blood Sugar _____ times a day  Blood Thinner / Instructions: none Aspirin Instructions:  none  ERAS Protocol Ordered: [x]  No  []  Yes Patient is to be NPO after:  midnight prior  Dental hx: []  Dentures:  []  N/A      []  Bridge or Partial:                   []  Loose or Damaged teeth:   Comments: [facility] was [facility] from [facility] schedule in [facility].   Activity level: Able to walk up 2 flights of stairs without becoming significantly short of breath or having chest pain?  []  No   []    Yes   Anesthesia review: anxiety, depression, deviated septum, morbidly obese (5'3" 250 lbs)  Patient denies shortness of breath, fever, cough and chest pain at PAT appointment.  Patient verbalized understanding and agreement to the Pre-Surgical Instructions that were given to them at this PAT appointment. Patient was also educated of the need to review these PAT instructions again prior to her surgery.I reviewed the appropriate phone numbers to call if they have any and questions or concerns.

## 2024-01-14 NOTE — Patient Instructions (Signed)
 SURGICAL WAITING ROOM VISITATION Patients having surgery or a procedure may have no more than 2 support people in the waiting area - these visitors may rotate in the visitor waiting room.   If the patient needs to stay at the hospital during part of their recovery, the visitor guidelines for inpatient rooms apply.  PRE-OP VISITATION  Pre-op nurse will coordinate an appropriate time for 1 support person to accompany the patient in pre-op.  This support person may not rotate.  This visitor will be contacted when the time is appropriate for the visitor to come back in the pre-op area.  Please refer to the [facility] website for the visitor guidelines for Inpatients (after your surgery is over and you are in a regular room).  You are not required to quarantine at this time prior to your surgery. However, you must do this: Hand Hygiene often Do NOT share personal items Notify your provider if you are in close contact with someone who has COVID or you develop fever 100.4 or greater, new onset of sneezing, cough, sore throat, shortness of breath or body aches.  If you test positive for Covid or have been in contact with anyone that has tested positive in the last 10 days please notify you surgeon.    Your procedure is scheduled on:  Friday  January 25, 2024  Report to [facility] Main Entrance: [address] entrance where the [employer] is available.   Report to admitting at:  07:45 AM  Call this number if you have any questions or problems the morning of surgery [phone]  DO NOT EAT OR DRINK ANYTHING AFTER MIDNIGHT THE NIGHT PRIOR TO YOUR SURGERY / PROCEDURE.   FOLLOW  ANY ADDITIONAL PRE OP INSTRUCTIONS YOU RECEIVED FROM YOUR SURGEON'S OFFICE!!!   Oral Hygiene is also important to reduce your risk of infection.        Remember - BRUSH YOUR TEETH THE MORNING OF SURGERY WITH YOUR REGULAR TOOTHPASTE  Do NOT smoke after Midnight the night before surgery.  STOP TAKING all Vitamins,  Herbs and supplements 1 week before your surgery.   Take ONLY these medicines the morning of surgery with A SIP OF WATER: Sertraline  (Zoloft ), and Ativan if needed. You may use your Flonase  nasal spray if needed.                     You may not have any metal on your body including hair pins, jewelry, and body piercing  Do not wear make-up, lotions, powders, perfumes  or deodorant  Do not wear nail polish including gel and S&S, artificial / acrylic nails, or any other type of covering on natural nails including finger and toenails. If you have artificial nails, gel coating, etc., that needs to be removed by a nail salon, Please have this removed prior to surgery. Not doing so may mean that your surgery could be cancelled or delayed if the Surgeon or anesthesia staff feels like they are unable to monitor you safely.   Do not shave 48 hours prior to surgery to avoid nicks in your skin which may contribute to postoperative infections.   Contacts, Hearing Aids, dentures or bridgework may not be worn into surgery. DENTURES WILL BE REMOVED PRIOR TO SURGERY PLEASE DO NOT APPLY "Poly grip" OR ADHESIVES!!!  You may bring a small overnight bag with you on the day of surgery, only pack items that are not valuable. [facility] IS NOT RESPONSIBLE   FOR VALUABLES  THAT ARE LOST OR STOLEN.   Patients discharged on the day of surgery will not be allowed to drive home.  Someone NEEDS to stay with you for the first 24 hours after anesthesia.  Do not bring your home medications to the hospital. The Pharmacy will dispense medications listed on your medication list to you during your admission in the Hospital.  Please read over the following fact sheets you were given: IF YOU HAVE QUESTIONS ABOUT YOUR PRE-OP INSTRUCTIONS, PLEASE CALL [phone].   [facility] - Preparing for Surgery Before surgery, you can play an important role.  Because skin is not sterile, your skin needs to be as free of germs as possible.   You can reduce the number of germs on your skin by washing with CHG (chlorahexidine gluconate) soap before surgery.  CHG is an antiseptic cleaner which kills germs and bonds with the skin to continue killing germs even after washing. Please DO NOT use if you have an allergy to CHG or antibacterial soaps.  If your skin becomes reddened/irritated stop using the CHG and inform your nurse when you arrive at Short Stay. Do not shave (including legs and underarms) for at least 48 hours prior to the first CHG shower.  You may shave your face/neck.  Please follow these instructions carefully:  1.  Shower with CHG Soap the night before surgery and the  morning of surgery.  2.  If you choose to wash your hair, wash your hair first as usual with your normal  shampoo.  3.  After you shampoo, rinse your hair and body thoroughly to remove the shampoo.                             4.  Use CHG as you would any other liquid soap.  You can apply chg directly to the skin and wash.  Gently with a scrungie or clean washcloth.  5.  Apply the CHG Soap to your body ONLY FROM THE NECK DOWN.   Do not use on face/ open                           Wound or open sores. Avoid contact with eyes, ears mouth and genitals (private parts).                       Wash face,  Genitals (private parts) with your normal soap.             6.  Wash thoroughly, paying special attention to the area where your  surgery  will be performed.  7.  Thoroughly rinse your body with warm water from the neck down.  8.  DO NOT shower/wash with your normal soap after using and rinsing off the CHG Soap.            9.  Pat yourself dry with a clean towel.            10.  Wear clean pajamas.            11.  Place clean sheets on your bed the night of your first shower and do not  sleep with pets.  ON THE DAY OF SURGERY : Do not apply any lotions/deodorants the morning of surgery.  Please wear clean clothes to the hospital/surgery center.    FAILURE TO  FOLLOW THESE INSTRUCTIONS MAY RESULT IN THE CANCELLATION  OF YOUR SURGERY  PATIENT SIGNATURE_________________________________  NURSE SIGNATURE__________________________________  ________________________________________________________________________

## 2024-01-15 ENCOUNTER — Encounter (HOSPITAL_COMMUNITY): Payer: Self-pay

## 2024-01-15 ENCOUNTER — Other Ambulatory Visit: Payer: Self-pay

## 2024-01-15 ENCOUNTER — Encounter (HOSPITAL_COMMUNITY)
Admission: RE | Admit: 2024-01-15 | Discharge: 2024-01-15 | Disposition: A | Discharge status: Home / Self Care | Source: Ambulatory Visit | Attending: Urology | Admitting: Urology

## 2024-01-15 VITALS — BP 111/72 | HR 82 | Temp 98.9°F | Resp 20 | Ht 63.0 in | Wt 244.0 lb

## 2024-01-15 DIAGNOSIS — F419 Anxiety disorder, unspecified: Secondary | ICD-10-CM

## 2024-01-15 DIAGNOSIS — Z01818 Encounter for other preprocedural examination: Secondary | ICD-10-CM | POA: Diagnosis not present

## 2024-01-15 DIAGNOSIS — E6609 Other obesity due to excess calories: Secondary | ICD-10-CM | POA: Insufficient documentation

## 2024-01-15 HISTORY — DX: Gastro-esophageal reflux disease without esophagitis: K21.9

## 2024-01-15 HISTORY — DX: Deviated nasal septum: J34.2

## 2024-01-15 HISTORY — DX: Unspecified osteoarthritis, unspecified site: M19.90

## 2024-01-15 LAB — BASIC METABOLIC PANEL WITH GFR
Anion gap: 8 (ref 5–15)
BUN: 14 mg/dL (ref 6–20)
CO2: 25 mmol/L (ref 22–32)
Calcium: 8.7 mg/dL — ABNORMAL LOW (ref 8.9–10.3)
Chloride: 104 mmol/L (ref 98–111)
Creatinine, Ser: 0.9 mg/dL (ref 0.44–1.00)
GFR, Estimated: >60 mL/min (ref >60)
Glucose, Bld: 103 mg/dL — ABNORMAL HIGH (ref 70–99)
Potassium: 4.2 mmol/L (ref 3.5–5.1)
Sodium: 137 mmol/L (ref 135–145)

## 2024-01-15 LAB — CBC
HCT: 44 % (ref 36.0–46.0)
Hemoglobin: 14.5 g/dL (ref 12.0–15.0)
MCH: 30 pg (ref 26.0–34.0)
MCHC: 33 g/dL (ref 30.0–36.0)
MCV: 91.1 fL (ref 80.0–100.0)
Platelets: 237 10*3/uL (ref 150–400)
RBC: 4.83 MIL/uL (ref 3.87–5.11)
RDW: 12.3 % (ref 11.5–15.5)
WBC: 7 10*3/uL (ref 4.0–10.5)
nRBC: 0 % (ref 0.0–0.2)

## 2024-01-22 NOTE — H&P (Signed)
 I was consulted to assess the patient's urinary incontinence. She leaks with coughing sneezing bending and lifting. No urge incontinence. No bedwetting. On a bad day she wears 2-4 pads a day that can be quite wet and even soaked. On a good day she wears 1-2 pads a day that are damp or less wet. Physical therapy has not helped a lot.   She has been told by her physical therapist that her pelvic floor support is good and she was wondering about the bulking treatment.   She voids every 1-3 hours and gets up once a night. She reports good flow and feels empty   She has not had a hysterectomy   On pelvic examination patient had mild grade 2 hypermobility the bladder neck mild positive cough test with a good cough. No prolapse. She had some vaginal discharge. No prolapse. She had mild suburethral swelling and in my opinion she does not have a urethral diverticulum   Urine reviewed. Urine sent for culture. Chart reviewed. Bladder scan 115 mL   Patient has mild to moderate stress incontinence. She has minimal frequency and nocturia. She may or may not have incomplete bladder emptying. Role of urodynamics and cystoscopy discussed. I will assess the urethra for bulking agent during cystoscopy. I will also look for evidence of a diverticulum on cystoscopy and urodynamics. Call if urine culture is positive. Urodynamics will reassess elevated residual    Today frequency stable. Incontinence stable. Urine culture negative  On urodynamics she voided 17 mL with max flow 3 mL/s. She was catheterized for few milliliters. Maxim bladder capacity was 450 mL. Bladder was stable. Her cuff leak point pressure at 200 mL was 20 cm of water and there was severe leakage. I 500 mL cough leak point pressure was 30 cmH2O with severe leakage. During voluntary voiding she voided 225 mL. She had interrupted prolonged pattern. Maxim flow was approximately 5 mL/s. Maxim voiding pressure 32 cm water. Residual was I believe 225 mL. She  double void in the restroom and private another 100 mL and her PVR was approximately 100 mL in total. Bladder neck distended less than a centimeter. EMG activity normal. No evidence of urethral diverticulum the details of the urodynamics are signed dictated  Cystoscopy: Normal bladder and urethra. No diverticulum. Long healthy straight urethra but was a bit tender  Picture drawn. Sling discussed. Bulking agent discussed. Based on body habitus and tenderness bulking agent first time in operating room I think would be prudent to consider.   Patient is already reviewed treatment options and was very educated regarding them. She would like to do the bulking agent. If retreatment rates become too high or not successful she will be willing to have a sling. We will do the first 1 under anesthesia but she is willing to do it in the office following that if I think technically things will be fine. She knew that the scope was quite short. Hopefully we can reach her treatment goal. He was hoping to avoid a sling based on her age. Mesh issue discussed      ALLERGIES: Codeine Sulfa Sulfa Drugs    MEDICATIONS: Omeprazole   Flonase  Allergy Relief  Sertraline  HCl     GU PSH: Complex cystometrogram, w/ void pressure and urethral pressure profile studies, any technique - 12/11/2023 Complex Uroflow - 12/11/2023 Emg surf Electrd - 12/11/2023 Inject For cystogram - 12/11/2023 Intrabd voidng Press - 12/11/2023     NON-GU PSH: Elbow Arthroscopy, Left Visit Complexity (formerly GPC1X) -  11/29/2023     GU PMH: Urinary Frequency - 12/11/2023, - 11/29/2023 Nocturia - 11/29/2023 Stress Incontinence - 11/29/2023, - 11/02/2023, - 11/01/2023, - 10/11/2023, - 09/26/2023, - 08/29/2023, - 07/23/2023, - 06/11/2023, - 05/25/2023, - 03/12/2023, - 04/07/2022, - 03/27/2022, - 02/27/2022    NON-GU PMH: Muscle weakness (generalized) - 11/02/2023, - 11/01/2023, - 10/11/2023, - 09/26/2023, - 08/29/2023, - 07/23/2023, - 06/11/2023, - 05/25/2023, - 03/12/2023, -  04/07/2022, - 03/27/2022, - 02/27/2022 Other muscle spasm - 11/02/2023, - 11/01/2023, - 10/11/2023, - 09/26/2023, - 08/29/2023, - 07/23/2023, - 06/11/2023, - 05/25/2023, - 04/07/2022, - 03/27/2022, - 02/27/2022 Other specified disorders of muscle - 11/02/2023, - 11/01/2023, - 10/11/2023, - 09/26/2023, - 08/29/2023, - 07/23/2023, - 06/11/2023, - 05/25/2023, - 04/07/2022, - 03/27/2022, - 02/27/2022 Anxiety Depression GERD Hypercholesterolemia    FAMILY HISTORY: 1 Daughter - Daughter Aneurysm - Father High Blood Pressure - Father kidney stone - Father Prediabetes - Father, Mother stroke - Father Thyroid  - Mother   SOCIAL HISTORY: Marital Status: Married Preferred Language: English; Ethnicity: Not Hispanic Or Latino; Race: White Current Smoking Status: Patient has never smoked.   Tobacco Use Assessment Completed: Used Tobacco in last 30 days? Does drink.  Drinks 1 caffeinated drink per day. Patient's occupation Physiological scientist.    REVIEW OF SYSTEMS:    GU Review Female:   Patient reports leakage of urine. Patient denies frequent urination, hard to postpone urination, burning /pain with urination, get up at night to urinate, stream starts and stops, trouble starting your stream, have to strain to urinate, and being pregnant.  Gastrointestinal (Upper):   Patient denies nausea, vomiting, and indigestion/ heartburn.  Gastrointestinal (Lower):   Patient denies diarrhea and constipation.  Constitutional:   Patient denies fever, night sweats, weight loss, and fatigue.  Skin:   Patient denies skin rash/ lesion and itching.  Eyes:   Patient denies blurred vision and double vision.  Ears/ Nose/ Throat:   Patient denies sore throat and sinus problems.  Hematologic/Lymphatic:   Patient denies swollen glands and easy bruising.  Cardiovascular:   Patient denies leg swelling and chest pains.  Respiratory:   Patient denies cough and shortness of breath.  Endocrine:   Patient denies excessive thirst.  Musculoskeletal:    Patient denies back pain and joint pain.  Neurological:   Patient denies headaches and dizziness.  Psychologic:   Patient denies depression and anxiety.   VITAL SIGNS: None   PAST DATA REVIEW: None   PROCEDURES:         Flexible Cystoscopy - 52000  Risks, benefits, and some of the potential complications of the procedure were discussed at length with the patient including infection, bleeding, voiding discomfort, urinary retention, fever, chills, sepsis, and others. All questions were answered. Informed consent was obtained. Sterile technique and intraurethral analgesia were used.  Ureteral Orifices:  Normal location. Normal size. Normal shape. Effluxed clear urine.  Bladder:  No trabeculation. No tumors. Normal mucosa. No stones.      The lower urinary tract was carefully examined. There was no stitch, foreign, body, or carcinoma. The procedure was well-tolerated and without complications. Antibiotic instructions were given. Instructions were given to call the office immediately for bloody urine, difficulty urinating, urinary retention, painful or frequent urination, fever, chills, nausea, vomiting or other illness. The patient stated that she understood these instructions and would comply with them.         Visit Complexity - G2211          Urinalysis w/Scope Dipstick Dipstick Cont'd Micro  Color: Yellow Bilirubin: Neg mg/dL WBC/hpf: 6 - 16/XWR  Appearance: Clear Ketones: Neg mg/dL RBC/hpf: 0 - 2/hpf  Specific Gravity: 1.025 Blood: Neg ery/uL Bacteria: Rare (0-9/hpf)  pH: 6.0 Protein: Neg mg/dL Cystals: NS (Not Seen)  Glucose: Neg mg/dL Urobilinogen: 0.2 mg/dL Casts: NS (Not Seen)    Nitrites: Neg Trichomonas: Not Present    Leukocyte Esterase: 1+ leu/uL Mucous: Not Present      Epithelial Cells: 0 - 5/hpf      Yeast: NS (Not Seen)      Sperm: Not Present    ASSESSMENT:      ICD-10 Details  1 GU:   Stress Incontinence - N39.3   2   Urinary Frequency - R35.0                Notes:   We talked about a sling in detail. Pros, cons, general surgical and anesthetic risks, and other options including behavioral therapy and watchful waiting were discussed. She understands that slings are generally successful in 90% of cases for stress incontinence, 50% for urge incontinence, and that in a small % of cases the incontinence can worsen. The risk of persistent, de novo, or worsening incontinence/dysfunction was discussed. Risks were described but not limited to the discussion of injury to neighboring structures including the bowel (with possible life-threatening sepsis and colostomy), bladder, urethra, vagina (all resulting in further surgery), and ureter (resulting in re-implantation). We also talked about the risk of retention requiring urethrolysis, extrusion requiring revision, and erosion resulting in further surgery. Bleeding risks and transfusion rates and the risk of infection were discussed. The risk of pelvic and abdominal pain syndromes, dyspareunia, and neuropathies were discussed. The need for CIC was described as well the usual post-operative course. The patient understands that she might not reach her treatment goal and that she might be worse following surgery.    The patient has stress incontinence We talked about urethral injectables in detail. Pros, cons, general surgical and anesthetic risks, and other options including behavioral therapy, watchful waiting, and surgery were discussed. She understands that injectables are generally successful in 40-70% of cases for stress incontinence, <50% for urge incontinence, and that in a small % of cases the incontinence can worsen. Risks were described but not limited to the risk of persistent, de novo, or worsening incontinence and flow symptoms were discussed. The need for long-term CIC is possible but rare. We also talked about the risk of erosion into bladder, urethra, and/or vagina with sequelae. Bleeding, infection, pain,  dysuria, dyspareunia, and urethral irritation risks were discussed. Retreatment rates were discussed. The patient understands that she might not reach her treatment goal and that she might be worse following surgery.

## 2024-01-25 ENCOUNTER — Encounter (HOSPITAL_COMMUNITY)
Admission: RE | Disposition: A | Discharge status: Home / Self Care | Payer: Self-pay | Source: Home / Self Care | Attending: Urology

## 2024-01-25 ENCOUNTER — Other Ambulatory Visit: Payer: Self-pay

## 2024-01-25 ENCOUNTER — Ambulatory Visit (HOSPITAL_COMMUNITY): Payer: Self-pay | Admitting: Anesthesiology

## 2024-01-25 ENCOUNTER — Encounter (HOSPITAL_COMMUNITY): Payer: Self-pay | Admitting: Urology

## 2024-01-25 ENCOUNTER — Ambulatory Visit (HOSPITAL_COMMUNITY)
Admission: RE | Admit: 2024-01-25 | Discharge: 2024-01-25 | Disposition: A | Discharge status: Home / Self Care | Attending: Urology | Admitting: Urology

## 2024-01-25 DIAGNOSIS — F419 Anxiety disorder, unspecified: Secondary | ICD-10-CM | POA: Diagnosis not present

## 2024-01-25 DIAGNOSIS — E66813 Obesity, class 3: Secondary | ICD-10-CM | POA: Insufficient documentation

## 2024-01-25 DIAGNOSIS — K219 Gastro-esophageal reflux disease without esophagitis: Secondary | ICD-10-CM | POA: Diagnosis not present

## 2024-01-25 DIAGNOSIS — M1990 Unspecified osteoarthritis, unspecified site: Secondary | ICD-10-CM | POA: Diagnosis not present

## 2024-01-25 DIAGNOSIS — N3642 Intrinsic sphincter deficiency (ISD): Secondary | ICD-10-CM | POA: Insufficient documentation

## 2024-01-25 DIAGNOSIS — Z6841 Body Mass Index (BMI) 40.0 and over, adult: Secondary | ICD-10-CM | POA: Insufficient documentation

## 2024-01-25 DIAGNOSIS — Z01818 Encounter for other preprocedural examination: Secondary | ICD-10-CM

## 2024-01-25 DIAGNOSIS — R350 Frequency of micturition: Secondary | ICD-10-CM | POA: Insufficient documentation

## 2024-01-25 DIAGNOSIS — F32A Depression, unspecified: Secondary | ICD-10-CM | POA: Diagnosis not present

## 2024-01-25 DIAGNOSIS — F418 Other specified anxiety disorders: Secondary | ICD-10-CM | POA: Diagnosis not present

## 2024-01-25 DIAGNOSIS — N393 Stress incontinence (female) (male): Secondary | ICD-10-CM | POA: Diagnosis not present

## 2024-01-25 HISTORY — PX: CYSTOSCOPY WITH INJECTION: SHX1424

## 2024-01-25 LAB — POCT PREGNANCY, URINE: Preg Test, Ur: NEGATIVE

## 2024-01-25 SURGERY — CYSTOSCOPY, WITH INJECTION OF BLADDER NECK OR BLADDER WALL
Anesthesia: Monitor Anesthesia Care | Site: Bladder

## 2024-01-25 MED ORDER — NON FORMULARY
Status: DC | PRN
  Administered 2024-01-25: 24 mL via INTRAVESICAL

## 2024-01-25 MED ORDER — FENTANYL CITRATE (PF) 100 MCG/2ML IJ SOLN
INTRAMUSCULAR | Status: AC
Start: 2024-01-25 — End: 2024-01-25
  Filled 2024-01-25: qty 2

## 2024-01-25 MED ORDER — FENTANYL CITRATE (PF) 100 MCG/2ML IJ SOLN
INTRAMUSCULAR | Status: DC | PRN
  Administered 2024-01-25 (×2): 50 ug via INTRAVENOUS

## 2024-01-25 MED ORDER — STERILE WATER FOR IRRIGATION IR SOLN
Status: DC | PRN
  Administered 2024-01-25: 3000 mL via INTRAVESICAL

## 2024-01-25 MED ORDER — FENTANYL CITRATE PF 50 MCG/ML IJ SOSY: 25.0000 ug | PREFILLED_SYRINGE | INTRAMUSCULAR | Status: DC | PRN

## 2024-01-25 MED ORDER — LIDOCAINE HCL (PF) 2 % IJ SOLN
INTRAMUSCULAR | Status: DC | PRN
Start: 2024-01-25 — End: 2024-01-25
  Administered 2024-01-25: 60 mg via INTRADERMAL

## 2024-01-25 MED ORDER — LACTATED RINGERS IV SOLN: INTRAVENOUS | Status: DC

## 2024-01-25 MED ORDER — METOCLOPRAMIDE HCL 5 MG/ML IJ SOLN
10.0000 mg | Freq: Once | INTRAMUSCULAR | Status: AC
  Administered 2024-01-25: 10 mg via INTRAVENOUS
  Filled 2024-01-25: qty 2

## 2024-01-25 MED ORDER — PROPOFOL 10 MG/ML IV BOLUS
INTRAVENOUS | Status: DC | PRN
  Administered 2024-01-25: 30 mg via INTRAVENOUS
  Administered 2024-01-25: 40 mg via INTRAVENOUS
  Administered 2024-01-25: 200 mg via INTRAVENOUS

## 2024-01-25 MED ORDER — LIDOCAINE HCL (PF) 2 % IJ SOLN
INTRAMUSCULAR | Status: AC
Start: 2024-01-25 — End: 2024-01-25
  Filled 2024-01-25: qty 5

## 2024-01-25 MED ORDER — ORAL CARE MOUTH RINSE: 15.0000 mL | Freq: Once | OROMUCOSAL | Status: AC

## 2024-01-25 MED ORDER — MIDAZOLAM HCL 2 MG/2ML IJ SOLN
INTRAMUSCULAR | Status: AC
  Filled 2024-01-25: qty 2

## 2024-01-25 MED ORDER — PROPOFOL 500 MG/50ML IV EMUL
INTRAVENOUS | Status: DC | PRN
  Administered 2024-01-25: 100 ug/kg/min via INTRAVENOUS

## 2024-01-25 MED ORDER — CHLORHEXIDINE GLUCONATE 0.12 % MT SOLN
15.0000 mL | Freq: Once | OROMUCOSAL | Status: AC
  Administered 2024-01-25: 15 mL via OROMUCOSAL

## 2024-01-25 MED ORDER — AMISULPRIDE (ANTIEMETIC) 5 MG/2ML IV SOLN: 10.0000 mg | Freq: Once | INTRAVENOUS | Status: DC | PRN

## 2024-01-25 MED ORDER — CIPROFLOXACIN IN D5W 400 MG/200ML IV SOLN
400.0000 mg | INTRAVENOUS | Status: AC
  Administered 2024-01-25: 400 mg via INTRAVENOUS
  Filled 2024-01-25: qty 200

## 2024-01-25 MED ORDER — DEXAMETHASONE SODIUM PHOSPHATE 10 MG/ML IJ SOLN
INTRAMUSCULAR | Status: DC | PRN
Start: 2024-01-25 — End: 2024-01-25
  Administered 2024-01-25: 5 mg via INTRAVENOUS

## 2024-01-25 MED ORDER — MIDAZOLAM HCL 5 MG/5ML IJ SOLN
INTRAMUSCULAR | Status: DC | PRN
  Administered 2024-01-25: 2 mg via INTRAVENOUS

## 2024-01-25 MED ORDER — ONDANSETRON HCL 4 MG/2ML IJ SOLN
INTRAMUSCULAR | Status: DC | PRN
  Administered 2024-01-25: 4 mg via INTRAVENOUS

## 2024-01-25 MED ORDER — ACETAMINOPHEN 500 MG PO TABS
1000.0000 mg | ORAL_TABLET | Freq: Once | ORAL | Status: AC
  Administered 2024-01-25: 1000 mg via ORAL
  Filled 2024-01-25: qty 2

## 2024-01-25 MED ORDER — ONDANSETRON HCL 4 MG/2ML IJ SOLN: 4.0000 mg | Freq: Once | INTRAMUSCULAR | Status: DC | PRN

## 2024-01-25 SURGICAL SUPPLY — 15 items
BAG URO CATCHER STRL LF (MISCELLANEOUS) ×1 IMPLANT
CATH ROBINSON RED A/P 14FR (CATHETERS) IMPLANT
CLOTH BEACON ORANGE TIMEOUT ST (SAFETY) ×1 IMPLANT
GLOVE SURG LX STRL 7.5 STRW (GLOVE) ×1 IMPLANT
GOWN STRL REUS W/ TWL XL LVL3 (GOWN DISPOSABLE) ×1 IMPLANT
KIT TURNOVER KIT A (KITS) ×1 IMPLANT
NDL ASPIRATION 22 (NEEDLE) ×1 IMPLANT
NDL SAFETY ECLIPSE 18X1.5 (NEEDLE) IMPLANT
NEEDLE ASPIRATION 22 (NEEDLE) IMPLANT
PACK CYSTO (CUSTOM PROCEDURE TRAY) ×1 IMPLANT
SYR 10ML LL (SYRINGE) IMPLANT
SYR CONTROL 10ML LL (SYRINGE) IMPLANT
SYSTEM URETHRAL BULK BULKAMID (Female Continence) IMPLANT
TUBING CONNECTING 10 (TUBING) IMPLANT
WATER STERILE IRR 3000ML UROMA (IV SOLUTION) ×1 IMPLANT

## 2024-01-25 NOTE — Anesthesia Procedure Notes (Signed)
 Procedure Name: MAC Date/Time: 01/25/2024 10:00 AM  Performed by: Rochell Chroman, CRNAPre-anesthesia Checklist: Patient identified, Emergency Drugs available, Suction available and Patient being monitored Patient Re-evaluated:Patient Re-evaluated prior to induction Oxygen Delivery Method: Simple face mask

## 2024-01-25 NOTE — Op Note (Signed)
 Preoperative diagnosis: Intrinsic sphincter deficiency and stress urinary incontinence Postoperative diagnosis: Intrinsic sphincter deficiency and stress urinary incontinence Surgery: Cystoscopy and injection of Bulkamid Surgeon: Dr. Geralyn Knee Rheanne Cortopassi  The patient has the above diagnoses and consented the above procedure.  Preoperative antibiotics were given.  Extra care was taken with leg positioning.  Based upon body habitus I did this in the operating room but l based on today's examination and anesthesia I believe it could be done safely in the clinic next time if needed  I used the Bulkamid scope as per protocol.  She had a long urethra that was a little bit torturous and I had to raise my chair very high.  She had little bit of descensus at rest.  I was diligent to try to inject in the mid urethra.  I initially injected at 7:00 and the patient was moving so we gave her MAC anesthesia.  IV sedation was not enough.  I then injected at 5:00 with an excellent coaptation in that area.  I then injected at 6:00 2:00 and 10:00.  She had a vascular urethra with a little bit of bleeding but it was not a clinical issue.  I thought I would just use a third of 1/3 syringe just in case.  Again I was happy with the final coaptation.  It was challenging to inject in the mid urethra and this was more based upon the urethral length  Bladder was partially emptied with 14 [place] In-N-Out catheter.  She may have a tendency not to empty efficiently and this was discussed prior to the case in the preoperative holding area.  She will be followed as per protocol

## 2024-01-25 NOTE — Anesthesia Preprocedure Evaluation (Addendum)
 Anesthesia Evaluation  Patient identified by MRN, date of birth, ID band Patient awake    Reviewed: Allergy & Precautions, NPO status , Patient's Chart, lab work & pertinent test results  Airway Mallampati: II  TM Distance: >3 FB Neck ROM: Full    Dental  (+) Teeth Intact, Dental Advisory Given, Caps,    Pulmonary neg pulmonary ROS   Pulmonary exam normal breath sounds clear to auscultation       Cardiovascular Exercise Tolerance: Good negative cardio ROS Normal cardiovascular exam Rhythm:Regular Rate:Normal     Neuro/Psych  PSYCHIATRIC DISORDERS Anxiety Depression    negative neurological ROS     GI/Hepatic Neg liver ROS,GERD  Medicated and Controlled,,  Endo/Other    Class 3 obesity  Renal/GU negative Renal ROS Bladder dysfunction      Musculoskeletal  (+) Arthritis ,    Abdominal   Peds  Hematology negative hematology ROS (+)   Anesthesia Other Findings Day of surgery medications reviewed with the patient.  Reproductive/Obstetrics                             Anesthesia Physical Anesthesia Plan  ASA: 3  Anesthesia Plan: MAC   Post-op Pain Management: Tylenol  PO (pre-op)*   Induction: Intravenous  PONV Risk Score and Plan: 2 and TIVA, Midazolam, Dexamethasone  and Ondansetron   Airway Management Planned: Natural Airway and Simple Face Mask  Additional Equipment:   Intra-op Plan:   Post-operative Plan:   Informed Consent: I have reviewed the patients History and Physical, chart, labs and discussed the procedure including the risks, benefits and alternatives for the proposed anesthesia with the patient or authorized representative who has indicated his/her understanding and acceptance.     Dental advisory given  Plan Discussed with: CRNA and Anesthesiologist  Anesthesia Plan Comments:        Anesthesia Quick Evaluation

## 2024-01-25 NOTE — Anesthesia Postprocedure Evaluation (Signed)
 Anesthesia Post Note  Patient: Brandi Foster  Procedure(s) Performed: CYSTOSCOPY, WITH INJECTION OF BLADDER NECK OR BLADDER WALL (Bladder)     Patient location during evaluation: PACU Anesthesia Type: General Level of consciousness: awake and alert Pain management: pain level controlled Vital Signs Assessment: post-procedure vital signs reviewed and stable Respiratory status: spontaneous breathing, nonlabored ventilation, respiratory function stable and patient connected to nasal cannula oxygen Cardiovascular status: blood pressure returned to baseline and stable Postop Assessment: no apparent nausea or vomiting Anesthetic complications: no   No notable events documented.  Last Vitals:  Vitals:   01/25/24 1115 01/25/24 1130  BP: 115/81 105/68  Pulse: 69 62  Resp: 17   Temp: 36.6 C   SpO2: 96% 100%    Last Pain:  Vitals:   01/25/24 1130  TempSrc:   PainSc: 0-No pain                 Erin Havers

## 2024-01-25 NOTE — Transfer of Care (Signed)
 Immediate Anesthesia Transfer of Care Note  Patient: Brandi Foster  Procedure(s) Performed: CYSTOSCOPY, WITH INJECTION OF BLADDER NECK OR BLADDER WALL (Bladder)  Patient Location: PACU  Anesthesia Type:GA combined with regional for post-op pain  Level of Consciousness: drowsy  Airway & Oxygen Therapy: Patient Spontanous Breathing  Post-op Assessment: Report given to RN and Post -op Vital signs reviewed and stable  Post vital signs: Reviewed and stable  Last Vitals:  Vitals Value Taken Time  BP 101/66 01/25/24 10:45  Temp 36.8 C 01/25/24 10:45  Pulse 65 01/25/24 10:46  Resp 14 01/25/24 10:46  SpO2 95 % 01/25/24 10:46  Vitals shown include unfiled device data.  Last Pain:  Vitals:   01/25/24 0839  TempSrc:   PainSc: 0-No pain         Complications: No notable events documented.

## 2024-01-25 NOTE — Anesthesia Procedure Notes (Signed)
 Procedure Name: LMA Insertion Date/Time: 01/25/2024 10:24 AM  Performed by: Rochell Chroman, CRNAPre-anesthesia Checklist: Emergency Drugs available, Patient identified, Suction available and Patient being monitored Patient Re-evaluated:Patient Re-evaluated prior to induction Oxygen Delivery Method: Circle system utilized Preoxygenation: Pre-oxygenation with 100% oxygen Induction Type: IV induction Ventilation: Mask ventilation without difficulty LMA: LMA inserted LMA Size: 4.0 Number of attempts: 1 Placement Confirmation: positive ETCO2 and breath sounds checked- equal and bilateral Tube secured with: Tape Dental Injury: Teeth and Oropharynx as per pre-operative assessment

## 2024-01-25 NOTE — Discharge Instructions (Signed)
 I have reviewed discharge instructions in detail with the patient. They will follow-up with me or their physician as scheduled. My nurse will also be calling the patients as per protocol.  We will call the patient on Monday

## 2024-01-25 NOTE — Interval H&P Note (Signed)
 History and Physical Interval Note:  01/25/2024 10:36 AM  Brandi Foster  has presented today for surgery, with the diagnosis of URINARY INCONTINENCE.  The various methods of treatment have been discussed with the patient and family. After consideration of risks, benefits and other options for treatment, the patient has consented to  Procedure(s) with comments: CYSTOSCOPY, WITH INJECTION OF BLADDER NECK OR BLADDER WALL (N/A) - INJECT BULKAMID as a surgical intervention.  The patient's history has been reviewed, patient examined, no change in status, stable for surgery.  I have reviewed the patient's chart and labs.  Questions were answered to the patient's satisfaction.     Jefry Lesinski A Prestyn Mahn

## 2024-01-28 ENCOUNTER — Encounter (HOSPITAL_COMMUNITY): Payer: Self-pay | Admitting: Urology

## 2024-01-28 DIAGNOSIS — F411 Generalized anxiety disorder: Secondary | ICD-10-CM | POA: Diagnosis not present

## 2024-01-28 DIAGNOSIS — R7301 Impaired fasting glucose: Secondary | ICD-10-CM | POA: Diagnosis not present

## 2024-01-28 DIAGNOSIS — R32 Unspecified urinary incontinence: Secondary | ICD-10-CM | POA: Diagnosis not present

## 2024-02-04 DIAGNOSIS — N393 Stress incontinence (female) (male): Secondary | ICD-10-CM | POA: Diagnosis not present

## 2024-04-28 DIAGNOSIS — R509 Fever, unspecified: Secondary | ICD-10-CM | POA: Diagnosis not present

## 2024-04-28 DIAGNOSIS — Z03818 Encounter for observation for suspected exposure to other biological agents ruled out: Secondary | ICD-10-CM | POA: Diagnosis not present

## 2024-04-28 DIAGNOSIS — R52 Pain, unspecified: Secondary | ICD-10-CM | POA: Diagnosis not present

## 2024-04-28 DIAGNOSIS — J019 Acute sinusitis, unspecified: Secondary | ICD-10-CM | POA: Diagnosis not present

## 2024-04-28 DIAGNOSIS — R5383 Other fatigue: Secondary | ICD-10-CM | POA: Diagnosis not present

## 2024-05-21 DIAGNOSIS — R051 Acute cough: Secondary | ICD-10-CM | POA: Diagnosis not present

## 2024-05-21 DIAGNOSIS — R062 Wheezing: Secondary | ICD-10-CM | POA: Diagnosis not present

## 2024-05-21 DIAGNOSIS — J40 Bronchitis, not specified as acute or chronic: Secondary | ICD-10-CM | POA: Diagnosis not present

## 2024-06-25 DIAGNOSIS — S4351XA Sprain of right acromioclavicular joint, initial encounter: Secondary | ICD-10-CM | POA: Diagnosis not present

## 2024-06-25 DIAGNOSIS — M25811 Other specified joint disorders, right shoulder: Secondary | ICD-10-CM | POA: Diagnosis not present

## 2024-06-25 DIAGNOSIS — M25511 Pain in right shoulder: Secondary | ICD-10-CM | POA: Diagnosis not present

## 2024-07-23 DIAGNOSIS — F411 Generalized anxiety disorder: Secondary | ICD-10-CM | POA: Diagnosis not present

## 2024-07-23 DIAGNOSIS — F320 Major depressive disorder, single episode, mild: Secondary | ICD-10-CM | POA: Diagnosis not present
